# Patient Record
Sex: Female | Born: 1990 | Race: Black or African American | Hispanic: No | Marital: Single | Smoking: Current every day smoker
Health system: Southern US, Community
[De-identification: ages and names within clinical notes are randomized; demographics above are authoritative.]

## PROBLEM LIST (undated history)

## (undated) ENCOUNTER — Inpatient Hospital Stay (HOSPITAL_COMMUNITY): Payer: Self-pay

## (undated) DIAGNOSIS — N760 Acute vaginitis: Secondary | ICD-10-CM

## (undated) DIAGNOSIS — A749 Chlamydial infection, unspecified: Secondary | ICD-10-CM

## (undated) DIAGNOSIS — A599 Trichomoniasis, unspecified: Secondary | ICD-10-CM

## (undated) DIAGNOSIS — B9689 Other specified bacterial agents as the cause of diseases classified elsewhere: Secondary | ICD-10-CM

## (undated) HISTORY — PX: INDUCED ABORTION: SHX677

---
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
## 2011-03-25 ENCOUNTER — Encounter: Payer: Self-pay | Admitting: *Deleted

## 2011-03-25 ENCOUNTER — Emergency Department (HOSPITAL_COMMUNITY)
Admission: EM | Admit: 2011-03-25 | Discharge: 2011-03-25 | Discharge status: Against Medical Advice | Payer: Medicaid Other | Attending: Emergency Medicine | Admitting: Emergency Medicine

## 2011-03-25 ENCOUNTER — Emergency Department (INDEPENDENT_AMBULATORY_CARE_PROVIDER_SITE_OTHER)
Admission: EM | Admit: 2011-03-25 | Discharge: 2011-03-25 | Disposition: A | Discharge status: Home / Self Care | Payer: Medicaid Other | Source: Home / Self Care | Attending: Emergency Medicine | Admitting: Emergency Medicine

## 2011-03-25 DIAGNOSIS — N760 Acute vaginitis: Secondary | ICD-10-CM

## 2011-03-25 DIAGNOSIS — B9689 Other specified bacterial agents as the cause of diseases classified elsewhere: Secondary | ICD-10-CM

## 2011-03-25 DIAGNOSIS — A499 Bacterial infection, unspecified: Secondary | ICD-10-CM

## 2011-03-25 DIAGNOSIS — Z0389 Encounter for observation for other suspected diseases and conditions ruled out: Secondary | ICD-10-CM | POA: Insufficient documentation

## 2011-03-25 LAB — POCT URINALYSIS DIP (DEVICE)
Glucose, UA: NEGATIVE mg/dL
Leukocytes, UA: NEGATIVE
Protein, ur: NEGATIVE mg/dL
Specific Gravity, Urine: 1.025 (ref 1.005–1.030)
Urobilinogen, UA: 1 mg/dL (ref 0.0–1.0)

## 2011-03-25 LAB — POCT PREGNANCY, URINE: Preg Test, Ur: NEGATIVE

## 2011-03-25 LAB — WET PREP, GENITAL: Trich, Wet Prep: NONE SEEN

## 2011-03-25 MED ORDER — METRONIDAZOLE 500 MG PO TABS: 500.0000 mg | ORAL_TABLET | Freq: Two times a day (BID) | ORAL | Status: DC

## 2011-03-25 NOTE — ED Notes (Signed)
Pt called five different times without answer.

## 2011-03-25 NOTE — ED Notes (Signed)
Pt  Reports  Symptoms  Of  Vaginal  Discharge          With  Foul  Odor   X  1  Week  She  denya  Any pain she  Is  In no distress  Walks  urpright     With steady  Gait and is  pleasant

## 2011-03-25 NOTE — ED Provider Notes (Signed)
History     CSN: [id number]  Arrival date & time 03/25/11  1758   First MD Initiated Contact with Patient 03/25/11 1804      Chief Complaint  Patient presents with  . Vaginal Discharge    (Consider location/radiation/quality/duration/timing/severity/associated sxs/prior treatment) HPI Comments: She is a 20 year old female who has had a one-week history of malodorous vaginal discharge. She denies any itching or irritation. She doesn't have any pelvic pain. She denies any urinary symptoms. She does have a history of bacterial vaginosis in the past. No history of yeast infections or STDs.  Patient is a 20 y.o. female presenting with vaginal discharge.  Vaginal Discharge Pertinent negatives include no abdominal pain.    History reviewed. No pertinent past medical history.  History reviewed. No pertinent past surgical history.  History reviewed. No pertinent family history.  History  Substance Use Topics  . Smoking status: Current Everyday Smoker  . Smokeless tobacco: Not on file  . Alcohol Use: Yes    OB History    Grav Para Term Preterm Abortions TAB SAB Ect Mult Living                  Review of Systems  Constitutional: Negative for fever and chills.  Gastrointestinal: Negative for nausea, vomiting, abdominal pain and diarrhea.  Genitourinary: Positive for vaginal discharge. Negative for dysuria, urgency, frequency, hematuria, vaginal bleeding, difficulty urinating, genital sores, vaginal pain, menstrual problem, pelvic pain and dyspareunia.    Allergies  Review of patient's allergies indicates no known allergies.  Home Medications   Current Outpatient Rx  Name Route Sig Dispense Refill  . METRONIDAZOLE 500 MG PO TABS Oral Take 1 tablet (500 mg total) by mouth 2 (two) times daily. 14 tablet 0    BP 123/63  Pulse 71  Temp(Src) 97.6 F (36.4 C) (Oral)  Resp 16  SpO2 100%  LMP 03/20/2011  Physical Exam  Nursing note and vitals reviewed. Constitutional:  She appears well-developed and well-nourished. No distress.  Cardiovascular: Normal rate, regular rhythm, normal heart sounds and intact distal pulses.  Exam reveals no gallop and no friction rub.   No murmur heard. Pulmonary/Chest: Effort normal and breath sounds normal. No respiratory distress. She has no wheezes. She has no rales.  Abdominal: Soft. Bowel sounds are normal. She exhibits no distension and no mass. There is no tenderness. There is no rebound and no guarding.  Genitourinary: Uterus normal. There is no rash or lesion on the right labia. There is no rash or lesion on the left labia. Uterus is not deviated, not enlarged, not fixed and not tender. Cervix exhibits no motion tenderness, no discharge and no friability. Right adnexum displays no mass and no tenderness. Left adnexum displays no mass and no tenderness. No erythema, tenderness or bleeding around the vagina. No foreign body around the vagina. Vaginal discharge found.       Pelvic exam reveals normal external genitalia. There was a small amount of whitish discharge which was non-malodorous. Cervix appears normal. No cervical motion tenderness. Uterus was midposition and normal in size and shape and nontender. No adnexal masses or tenderness.  Skin: Skin is warm and dry. No rash noted. She is not diaphoretic.    ED Course  Procedures (including critical care time)  Results for orders placed during the hospital encounter of 03/25/11  POCT URINALYSIS DIP (DEVICE)      Component Value Range   Glucose, UA NEGATIVE  NEGATIVE (mg/dL)   Bilirubin Urine NEGATIVE  NEGATIVE  Ketones, ur NEGATIVE  NEGATIVE (mg/dL)   Specific Gravity, Urine 1.025  1.005 - 1.030    Hgb urine dipstick NEGATIVE  NEGATIVE    pH 6.5  5.0 - 8.0    Protein, ur NEGATIVE  NEGATIVE (mg/dL)   Urobilinogen, UA 1.0  0.0 - 1.0 (mg/dL)   Nitrite NEGATIVE  NEGATIVE    Leukocytes, UA NEGATIVE  NEGATIVE   POCT PREGNANCY, URINE      Component Value Range   Preg  Test, Ur NEGATIVE    WET PREP, GENITAL      Component Value Range   Yeast, Wet Prep NONE SEEN  NONE SEEN    Trich, Wet Prep NONE SEEN  NONE SEEN    Clue Cells, Wet Prep MANY (*) NONE SEEN    WBC, Wet Prep HPF POC FEW (*) NONE SEEN     Labs Reviewed  WET PREP, GENITAL - Abnormal; Notable for the following:    Clue Cells, Wet Prep MANY (*)    WBC, Wet Prep HPF POC FEW (*)    All other components within normal limits  POCT URINALYSIS DIP (DEVICE)  POCT PREGNANCY, URINE  POCT URINALYSIS DIPSTICK  POCT PREGNANCY, URINE  POCT PREGNANCY, URINE  GC/CHLAMYDIA PROBE AMP, GENITAL   No results found.   1. Bacterial vaginosis       MDM          Roque Lias, MD 03/25/11 2059

## 2011-03-26 LAB — GC/CHLAMYDIA PROBE AMP, GENITAL: GC Probe Amp, Genital: NEGATIVE

## 2011-03-26 NOTE — ED Notes (Signed)
Wet prep: Many clue cells, few WBC's, GC/Chlamydia neg.  Pt. adequately treated with Flagyl. Vassie Moselle 03/26/2011

## 2011-04-04 ENCOUNTER — Emergency Department (INDEPENDENT_AMBULATORY_CARE_PROVIDER_SITE_OTHER)
Admission: EM | Admit: 2011-04-04 | Discharge: 2011-04-04 | Disposition: A | Discharge status: Home / Self Care | Payer: Medicaid Other | Source: Home / Self Care | Attending: Emergency Medicine | Admitting: Emergency Medicine

## 2011-04-04 ENCOUNTER — Encounter (HOSPITAL_COMMUNITY): Payer: Self-pay | Admitting: *Deleted

## 2011-04-04 DIAGNOSIS — N739 Female pelvic inflammatory disease, unspecified: Secondary | ICD-10-CM

## 2011-04-04 HISTORY — DX: Other specified bacterial agents as the cause of diseases classified elsewhere: B96.89

## 2011-04-04 HISTORY — DX: Acute vaginitis: N76.0

## 2011-04-04 LAB — CBC
HCT: 43.5 % (ref 36.0–46.0)
MCHC: 34.7 g/dL (ref 30.0–36.0)
Platelets: 275 10*3/uL (ref 150–400)
RDW: 13.7 % (ref 11.5–15.5)
WBC: 7.8 10*3/uL (ref 4.0–10.5)

## 2011-04-04 LAB — POCT URINALYSIS DIP (DEVICE)
Bilirubin Urine: NEGATIVE
Glucose, UA: NEGATIVE mg/dL
Hgb urine dipstick: NEGATIVE
Ketones, ur: NEGATIVE mg/dL
Specific Gravity, Urine: 1.025 (ref 1.005–1.030)
pH: 7 (ref 5.0–8.0)

## 2011-04-04 LAB — DIFFERENTIAL
Basophils Absolute: 0 10*3/uL (ref 0.0–0.1)
Basophils Relative: 0 % (ref 0–1)
Lymphocytes Relative: 31 % (ref 12–46)
Monocytes Absolute: 0.5 10*3/uL (ref 0.1–1.0)
Neutro Abs: 4.9 10*3/uL (ref 1.7–7.7)

## 2011-04-04 LAB — WET PREP, GENITAL
Clue Cells Wet Prep HPF POC: NONE SEEN
Trich, Wet Prep: NONE SEEN
WBC, Wet Prep HPF POC: NONE SEEN

## 2011-04-04 LAB — POCT PREGNANCY, URINE: Preg Test, Ur: NEGATIVE

## 2011-04-04 MED ORDER — AZITHROMYCIN 250 MG PO TABS
ORAL_TABLET | ORAL | Status: AC
  Filled 2011-04-04: qty 4

## 2011-04-04 MED ORDER — LIDOCAINE HCL (PF) 1 % IJ SOLN
INTRAMUSCULAR | Status: AC
  Filled 2011-04-04: qty 5

## 2011-04-04 MED ORDER — AZITHROMYCIN 500 MG PO TABS: ORAL_TABLET | ORAL | Status: DC

## 2011-04-04 MED ORDER — CEFTRIAXONE SODIUM 250 MG IJ SOLR
250.0000 mg | Freq: Once | INTRAMUSCULAR | Status: AC
  Administered 2011-04-04: 250 mg via INTRAMUSCULAR

## 2011-04-04 MED ORDER — AZITHROMYCIN 250 MG PO TABS
1000.0000 mg | ORAL_TABLET | Freq: Once | ORAL | Status: AC
  Administered 2011-04-04: 1000 mg via ORAL

## 2011-04-04 MED ORDER — ONDANSETRON 4 MG PO TBDP
4.0000 mg | ORAL_TABLET | Freq: Once | ORAL | Status: AC
  Administered 2011-04-04: 4 mg via ORAL

## 2011-04-04 MED ORDER — METRONIDAZOLE 500 MG PO TABS: 500.0000 mg | ORAL_TABLET | Freq: Two times a day (BID) | ORAL | Status: AC

## 2011-04-04 MED ORDER — ONDANSETRON 4 MG PO TBDP
ORAL_TABLET | ORAL | Status: AC
  Filled 2011-04-04: qty 1

## 2011-04-04 MED ORDER — CEFTRIAXONE SODIUM 250 MG IJ SOLR
INTRAMUSCULAR | Status: AC
  Filled 2011-04-04: qty 250

## 2011-04-04 NOTE — ED Provider Notes (Signed)
History     CSN: [id number]  Arrival date & time 04/04/11  1237   First MD Initiated Contact with Patient 04/04/11 1317      Chief Complaint  Patient presents with  . Abdominal Pain    (Consider location/radiation/quality/duration/timing/severity/associated sxs/prior treatment) HPI Comments: Tammy Gardner is a 20 year old female who was seen here 10 days ago with vaginal discharge. Wet prep showed clue cells and she was treated with metronidazole. Her discharge has gotten better. She also had a negative urinalysis and negative GC and chlamydia probe. Over the past 2 or 3 days she's developed pain in the right flank area. This initially was intermittent but now has become constant and it seems to be getting worse, however it's only a 3-4/10 in intensity. It's worse if she stands up and better if she sits. Not worse with eating or with intercourse. She denies any fever, chills, sweats, anorexia, nausea, vomiting, constipation, diarrhea, or blood in the stool. She's had no dysuria, frequency, urgency, or hematuria. She denies any vaginal discharge or itching. Her last menstrual period was December 15. She is sexually active and not using condoms or any other form of birth control.  Patient is a 20 y.o. female presenting with abdominal pain.  Abdominal Pain The primary symptoms of the illness include abdominal pain. The primary symptoms of the illness do not include fever, shortness of breath, nausea, vomiting, diarrhea or dysuria.  Symptoms associated with the illness do not include chills, constipation, urgency or frequency.    Past Medical History  Diagnosis Date  . Bacterial vaginosis     History reviewed. No pertinent past surgical history.  History reviewed. No pertinent family history.  History  Substance Use Topics  . Smoking status: Current Everyday Smoker  . Smokeless tobacco: Not on file  . Alcohol Use: Yes    OB History    Grav Para Term Preterm Abortions TAB SAB Ect Mult  Living                  Review of Systems  Constitutional: Negative for fever, chills, appetite change and unexpected weight change.  Respiratory: Negative for cough, shortness of breath and wheezing.   Cardiovascular: Negative for chest pain.  Gastrointestinal: Positive for abdominal pain. Negative for nausea, vomiting, diarrhea, constipation, blood in stool, abdominal distention, anal bleeding and rectal pain.  Genitourinary: Negative for dysuria, urgency and frequency.  Skin: Negative for rash.    Allergies  Review of patient's allergies indicates no known allergies.  Home Medications   Current Outpatient Rx  Name Route Sig Dispense Refill  . AZITHROMYCIN 500 MG PO TABS  Take 2 tablets at one time on Sunday, January 6,2013. 2 tablet 0  . METRONIDAZOLE 500 MG PO TABS Oral Take 1 tablet (500 mg total) by mouth 2 (two) times daily. 28 tablet 0    BP 138/77  Pulse 66  Temp(Src) 98.2 F (36.8 C) (Oral)  Resp 16  SpO2 100%  LMP 03/20/2011  Physical Exam  Nursing note and vitals reviewed. Constitutional: She appears well-developed and well-nourished. No distress.  Eyes: No scleral icterus.  Cardiovascular: Normal rate, regular rhythm and normal heart sounds.  Exam reveals no gallop and no friction rub.   No murmur heard. Pulmonary/Chest: Effort normal and breath sounds normal. No respiratory distress. She has no wheezes. She has no rales.  Abdominal: Soft. Bowel sounds are normal. She exhibits no distension and no mass. There is no hepatosplenomegaly. There is tenderness (there was mild tenderness to  palpation in the right flank area without guarding or rebound. She had no right lower quadrant tenderness or tenderness elsewhere.). There is no rebound, no guarding and no CVA tenderness.  Genitourinary: Vagina normal. No vaginal discharge found.       Pelvic exam reveals normal external genitalia. Vaginal and cervical mucosa were unremarkable. There was no discharge. She had  moderate cervical motion tenderness. Uterus was midposition and normal in size and shape and mildly tender. She had mild bilateral adnexal tenderness without any masses.  Skin: Skin is warm and dry. No rash noted. She is not diaphoretic.    ED Course  Procedures (including critical care time)  Results for orders placed during the hospital encounter of 04/04/11  POCT URINALYSIS DIP (DEVICE)      Component Value Range   Glucose, UA NEGATIVE  NEGATIVE (mg/dL)   Bilirubin Urine NEGATIVE  NEGATIVE    Ketones, ur NEGATIVE  NEGATIVE (mg/dL)   Specific Gravity, Urine 1.025  1.005 - 1.030    Hgb urine dipstick NEGATIVE  NEGATIVE    pH 7.0  5.0 - 8.0    Protein, ur NEGATIVE  NEGATIVE (mg/dL)   Urobilinogen, UA 0.2  0.0 - 1.0 (mg/dL)   Nitrite NEGATIVE  NEGATIVE    Leukocytes, UA NEGATIVE  NEGATIVE   WET PREP, GENITAL      Component Value Range   Yeast, Wet Prep NONE SEEN  NONE SEEN    Trich, Wet Prep NONE SEEN  NONE SEEN    Clue Cells, Wet Prep NONE SEEN  NONE SEEN    WBC, Wet Prep HPF POC NONE SEEN  NONE SEEN   CBC      Component Value Range   WBC 7.8  4.0 - 10.5 (K/uL)   RBC 5.11  3.87 - 5.11 (MIL/uL)   Hemoglobin 15.1 (*) 12.0 - 15.0 (g/dL)   HCT 16.1  09.6 - 04.5 (%)   MCV 85.1  78.0 - 100.0 (fL)   MCH 29.5  26.0 - 34.0 (pg)   MCHC 34.7  30.0 - 36.0 (g/dL)   RDW 40.9  81.1 - 91.4 (%)   Platelets 275  150 - 400 (K/uL)  DIFFERENTIAL      Component Value Range   Neutrophils Relative 62  43 - 77 (%)   Neutro Abs 4.9  1.7 - 7.7 (K/uL)   Lymphocytes Relative 31  12 - 46 (%)   Lymphs Abs 2.4  0.7 - 4.0 (K/uL)   Monocytes Relative 6  3 - 12 (%)   Monocytes Absolute 0.5  0.1 - 1.0 (K/uL)   Eosinophils Relative 1  0 - 5 (%)   Eosinophils Absolute 0.0  0.0 - 0.7 (K/uL)   Basophils Relative 0  0 - 1 (%)   Basophils Absolute 0.0  0.0 - 0.1 (K/uL)  POCT PREGNANCY, URINE      Component Value Range   Preg Test, Ur NEGATIVE       Labs Reviewed  CBC - Abnormal; Notable for the  following:    Hemoglobin 15.1 (*)    All other components within normal limits  POCT URINALYSIS DIP (DEVICE)  WET PREP, GENITAL  DIFFERENTIAL  POCT PREGNANCY, URINE  POCT URINALYSIS DIPSTICK  POCT PREGNANCY, URINE  GC/CHLAMYDIA PROBE AMP, GENITAL   No results found.   1. Pelvic inflammatory disease       MDM  She has right flank tenderness with a normal CBC and urinalysis. On pelvic there is mild to moderate tenderness on  cervical motion and over the adnexa bilaterally consistent with possible PID. Will go ahead and treat for pelvic inflammatory disease with IM Rocephin, azithromycin 1 g now and 1 g it week by mouth and metronidazole 500 mg twice a day for 2 weeks. She was instructed to followup with a gynecologist to do better in 2 weeks and return here if she became worse in any way.        Roque Lias, MD 04/04/11 [phone]

## 2011-04-04 NOTE — ED Notes (Signed)
Pt given crackers and soda. 

## 2011-04-04 NOTE — ED Notes (Signed)
Pt discharged immediately back at desk stated felt nauseous vomited small amount dr Lorenz Coaster notified - zofran 4mg  odt given and pt discharged

## 2011-04-04 NOTE — ED Notes (Signed)
Pt with c/o abdominal pain right mid to lower abd onset x 2 days - pt recently treated with flagyl completed antibiotic yesterday - denies vaginal discharge or irritation - denies urinary symptoms - pt states afraid may be chlamydia

## 2011-04-06 LAB — GC/CHLAMYDIA PROBE AMP, GENITAL
Chlamydia, DNA Probe: UNDETERMINED
GC Probe Amp, Genital: NEGATIVE

## 2011-04-07 ENCOUNTER — Telehealth (HOSPITAL_COMMUNITY): Payer: Self-pay | Admitting: *Deleted

## 2011-04-07 NOTE — ED Notes (Signed)
GC neg., Chlamydia indeterminate.  Pt. adequately treated with Zithromax. Pt. Verified, notified and given instructions.  She asked if she needs to take the Metrondizole.  I told her yes to finish it.   No DHHS form required for Indeterminate results. Tammy Gardner 04/07/2011

## 2011-04-12 NOTE — ED Notes (Signed)
1/2 Discussed with Dr. Lorenz Coaster and he said pt. needs to finish metronidazole and remind her to take the 2nd dose of Azithromycin. Left message 1/3 and 1/4.  Pt. called back and was given these instructions. Pt.'s questions about her Rx. answered. Vassie Moselle 04/12/2011

## 2011-04-26 ENCOUNTER — Telehealth (HOSPITAL_COMMUNITY): Payer: Self-pay | Admitting: *Deleted

## 2011-04-29 ENCOUNTER — Encounter (HOSPITAL_COMMUNITY): Payer: Self-pay | Admitting: *Deleted

## 2011-04-29 ENCOUNTER — Emergency Department (INDEPENDENT_AMBULATORY_CARE_PROVIDER_SITE_OTHER)
Admission: EM | Admit: 2011-04-29 | Discharge: 2011-04-29 | Disposition: A | Discharge status: Home / Self Care | Payer: Self-pay | Source: Home / Self Care | Attending: Family Medicine | Admitting: Family Medicine

## 2011-04-29 DIAGNOSIS — B373 Candidiasis of vulva and vagina: Secondary | ICD-10-CM

## 2011-04-29 HISTORY — DX: Chlamydial infection, unspecified: A74.9

## 2011-04-29 HISTORY — DX: Trichomoniasis, unspecified: A59.9

## 2011-04-29 LAB — POCT PREGNANCY, URINE: Preg Test, Ur: NEGATIVE

## 2011-04-29 LAB — POCT URINALYSIS DIP (DEVICE)
Bilirubin Urine: NEGATIVE
Nitrite: NEGATIVE
Protein, ur: NEGATIVE mg/dL
pH: 7 (ref 5.0–8.0)

## 2011-04-29 LAB — WET PREP, GENITAL: Clue Cells Wet Prep HPF POC: NONE SEEN

## 2011-04-29 MED ORDER — FLUCONAZOLE 150 MG PO TABS: ORAL_TABLET | ORAL | Status: AC

## 2011-04-29 NOTE — ED Provider Notes (Signed)
History     CSN: [id number]  Arrival date & time 04/29/11  1738   First MD Initiated Contact with Patient 04/29/11 1811      Chief Complaint  Patient presents with  . Vaginal Discharge    (Consider location/radiation/quality/duration/timing/severity/associated sxs/prior treatment) HPI Comments: Patient with a nonodorous white vaginal discharge and vaginal itching for the past several days. States that this started after being treated for PID. Patient states that she took the second gram of azithromycin  and 2 weeks of Flagyl. Patient also states had sexual intercourse with the same female partner and did not use condoms. STDs not a concern today. No urinary urgency, frequency, dysuria, genital blisters, rash, abdominal pain, fevers, back pain. Has not tried anything for this. Similar sx before when had yeast infection. Also with h/o BV chlamydia, trichmonoas. No h/o gonorrhea syphilis, herpes, HIV.   ROS as noted in HPI. All other ROS negative.   The history is provided by the patient. No language interpreter was used.    Past Medical History  Diagnosis Date  . Bacterial vaginosis     History reviewed. No pertinent past surgical history.  History reviewed. No pertinent family history.  History  Substance Use Topics  . Smoking status: Current Everyday Smoker  . Smokeless tobacco: Not on file  . Alcohol Use: Yes    OB History    Grav Para Term Preterm Abortions TAB SAB Ect Mult Living                  Review of Systems  Allergies  Review of patient's allergies indicates no known allergies.  Home Medications   Current Outpatient Rx  Name Route Sig Dispense Refill  . FLUCONAZOLE 150 MG PO TABS  1 tab po x 1. May repeat in 72 hours if no improvement 2 tablet 0    BP 130/72  Pulse 82  Temp(Src) 97.8 F (36.6 C) (Oral)  Resp 16  SpO2 100%  LMP 04/14/2011  Physical Exam  Nursing note and vitals reviewed. Constitutional: She is oriented to person, place, and  time. She appears well-developed and well-nourished. No distress.  HENT:  Head: Normocephalic and atraumatic.  Eyes: EOM are normal. Pupils are equal, round, and reactive to light.  Neck: Normal range of motion.  Cardiovascular: Normal rate, regular rhythm and normal heart sounds.   Pulmonary/Chest: Effort normal and breath sounds normal.  Abdominal: Soft. Bowel sounds are normal. She exhibits no distension. There is no tenderness. There is no rebound and no guarding.  Genitourinary: Uterus normal. Pelvic exam was performed with patient supine. There is no rash on the right labia. There is no rash on the left labia. Uterus is not tender. Cervix exhibits discharge. Cervix exhibits no motion tenderness and no friability. Right adnexum displays no mass, no tenderness and no fullness. Left adnexum displays no mass, no tenderness and no fullness. No erythema, tenderness or bleeding around the vagina. No foreign body around the vagina.       Thick white nonoderous vaginal d/c with cottage cheese appearance.Chaperone present during exam  Musculoskeletal: Normal range of motion.  Neurological: She is alert and oriented to person, place, and time.  Skin: Skin is warm and dry.  Psychiatric: She has a normal mood and affect. Her behavior is normal. Judgment and thought content normal.    ED Course  Procedures (including critical care time)  Labs Reviewed  POCT URINALYSIS DIP (DEVICE) - Abnormal; Notable for the following:    Leukocytes, UA  SMALL (*) Biochemical Testing Only. Please order routine urinalysis from main lab if confirmatory testing is needed.   All other components within normal limits  POCT PREGNANCY, URINE  POCT URINALYSIS DIPSTICK  POCT PREGNANCY, URINE  WET PREP, GENITAL  GC/CHLAMYDIA PROBE AMP, GENITAL   No results found.   1. Yeast vaginitis      MDM  Patient tx'd 12/20 for BV.  seen again on 04/04/2011 with lower abdominal and right flank pain. UA CBC wet prep were all  normal. Chlamydia indeterminate. Patient thought to have PID, was treated with IM Rocephin, azithromycin, repeat dose of azithromycin 1 g one week later and Flagyl 500 mg twice a day for 2 weeks.  H&P most c/w yeast infectionSent off GC/chlamydia, wet prep Will not treat empirically now. udcip noted. Pt with no urinary c/o. Will send home with diflucan for yeast infection. Advised pt to refrain from sexual contact until she knows lab results, symptoms resolve, and partner(s) are treated. Pt provided working phone number. Pt agrees.    Luiz Blare, MD 04/29/11 2007

## 2011-04-29 NOTE — ED Notes (Signed)
States she finished all rx medication the way she was supposed to take it

## 2011-04-29 NOTE — ED Notes (Signed)
States still having white vaginal discharge that looks like cottage cheese. States there is no odor

## 2011-04-30 LAB — GC/CHLAMYDIA PROBE AMP, GENITAL
Chlamydia, DNA Probe: NEGATIVE
GC Probe Amp, Genital: NEGATIVE

## 2011-05-03 NOTE — ED Notes (Signed)
GC/Chlamydia neg., Wet prep: Many yeast, mod. WBC's.  Pt. adequately treated with Diflucan. Vassie Moselle 05/03/2011

## 2011-05-14 ENCOUNTER — Telehealth (HOSPITAL_COMMUNITY): Payer: Self-pay | Admitting: *Deleted

## 2011-10-23 ENCOUNTER — Emergency Department (HOSPITAL_COMMUNITY)
Admission: EM | Admit: 2011-10-23 | Discharge: 2011-10-23 | Disposition: A | Discharge status: Home / Self Care | Payer: Self-pay

## 2011-10-23 NOTE — ED Notes (Signed)
Pt called x1. Unable to locate at the time. 

## 2012-03-31 ENCOUNTER — Encounter (HOSPITAL_BASED_OUTPATIENT_CLINIC_OR_DEPARTMENT_OTHER): Payer: Self-pay

## 2012-03-31 ENCOUNTER — Emergency Department (HOSPITAL_BASED_OUTPATIENT_CLINIC_OR_DEPARTMENT_OTHER)
Admission: EM | Admit: 2012-03-31 | Discharge: 2012-03-31 | Disposition: A | Discharge status: Home / Self Care | Payer: Self-pay | Attending: Emergency Medicine | Admitting: Emergency Medicine

## 2012-03-31 DIAGNOSIS — Z8619 Personal history of other infectious and parasitic diseases: Secondary | ICD-10-CM | POA: Insufficient documentation

## 2012-03-31 DIAGNOSIS — N760 Acute vaginitis: Secondary | ICD-10-CM | POA: Insufficient documentation

## 2012-03-31 DIAGNOSIS — B9689 Other specified bacterial agents as the cause of diseases classified elsewhere: Secondary | ICD-10-CM

## 2012-03-31 DIAGNOSIS — Z87891 Personal history of nicotine dependence: Secondary | ICD-10-CM | POA: Insufficient documentation

## 2012-03-31 DIAGNOSIS — R3915 Urgency of urination: Secondary | ICD-10-CM | POA: Insufficient documentation

## 2012-03-31 DIAGNOSIS — Z3202 Encounter for pregnancy test, result negative: Secondary | ICD-10-CM | POA: Insufficient documentation

## 2012-03-31 DIAGNOSIS — Z8742 Personal history of other diseases of the female genital tract: Secondary | ICD-10-CM | POA: Insufficient documentation

## 2012-03-31 LAB — PREGNANCY, URINE: Preg Test, Ur: NEGATIVE

## 2012-03-31 LAB — URINALYSIS, ROUTINE W REFLEX MICROSCOPIC
Ketones, ur: NEGATIVE mg/dL
Nitrite: NEGATIVE
Protein, ur: NEGATIVE mg/dL
Urobilinogen, UA: 1 mg/dL (ref 0.0–1.0)

## 2012-03-31 LAB — WET PREP, GENITAL

## 2012-03-31 LAB — URINE MICROSCOPIC-ADD ON

## 2012-03-31 MED ORDER — METRONIDAZOLE 500 MG PO TABS: 500.0000 mg | ORAL_TABLET | Freq: Two times a day (BID) | ORAL | Status: DC

## 2012-03-31 MED ORDER — METRONIDAZOLE 500 MG PO TABS
500.0000 mg | ORAL_TABLET | Freq: Once | ORAL | Status: AC
  Administered 2012-03-31: 500 mg via ORAL
  Filled 2012-03-31: qty 1

## 2012-03-31 NOTE — ED Provider Notes (Signed)
History     CSN: [id number]  Arrival date & time 03/31/12  1048   First MD Initiated Contact with Patient 03/31/12 1101      Chief Complaint  Patient presents with  . Vaginal Discharge    (Consider location/radiation/quality/duration/timing/severity/associated sxs/prior treatment) The history is provided by the patient.  Tammy Gardner is a 21 y.o. female hx of recurrent BV, chlamydia here with vaginal discharge, urgency to void. Whitish vaginal discharge for 5 days. No foul odor. Had BV a month ago and finished 7 days of flagyl. No abdominal pain or cramps. + urgency but no dysuria or frequency. No hx of UTIs. She hasn't been sexually active for 2 months. No fevers.    Past Medical History  Diagnosis Date  . Bacterial vaginosis   . Chlamydia   . Trichomonas     History reviewed. No pertinent past surgical history.  No family history on file.  History  Substance Use Topics  . Smoking status: Former Games developer  . Smokeless tobacco: Not on file  . Alcohol Use: No    OB History    Grav Para Term Preterm Abortions TAB SAB Ect Mult Living                  Review of Systems  Genitourinary: Positive for vaginal discharge and difficulty urinating.  All other systems reviewed and are negative.    Allergies  Review of patient's allergies indicates no known allergies.  Home Medications  No current outpatient prescriptions on file.  BP 119/71  Pulse 66  Temp 97.9 F (36.6 C) (Oral)  Resp 20  Ht 4\' 11"  (1.499 m)  Wt 115 lb (52.164 kg)  BMI 23.23 kg/m2  SpO2 100%  LMP 02/04/2012  Physical Exam  Nursing note and vitals reviewed. Constitutional: She is oriented to person, place, and time. She appears well-developed and well-nourished.  HENT:  Head: Normocephalic.  Mouth/Throat: Oropharynx is clear and moist.  Eyes: Conjunctivae normal are normal. Pupils are equal, round, and reactive to light.  Neck: Normal range of motion. Neck supple.  Cardiovascular: Normal  rate.   Pulmonary/Chest: Effort normal.  Abdominal: Soft. Bowel sounds are normal. She exhibits no distension. There is no tenderness. There is no rebound.  Genitourinary:       Whitish discharge, not foul smelling. No CMT or adnexal tenderness or uterine tenderness.   Musculoskeletal: Normal range of motion.  Neurological: She is alert and oriented to person, place, and time.  Skin: Skin is warm and dry.  Psychiatric: She has a normal mood and affect. Her behavior is normal. Judgment and thought content normal.    ED Course  Procedures (including critical care time)  Labs Reviewed  URINALYSIS, ROUTINE W REFLEX MICROSCOPIC - Abnormal; Notable for the following:    Color, Urine AMBER (*)  BIOCHEMICALS MAY BE AFFECTED BY COLOR   APPearance TURBID (*)     Specific Gravity, Urine 1.034 (*)     Bilirubin Urine SMALL (*)     Leukocytes, UA TRACE (*)     All other components within normal limits  WET PREP, GENITAL - Abnormal; Notable for the following:    Clue Cells Wet Prep HPF POC FEW (*)     WBC, Wet Prep HPF POC MODERATE (*)     All other components within normal limits  URINE MICROSCOPIC-ADD ON - Abnormal; Notable for the following:    Squamous Epithelial / LPF MANY (*)     Bacteria, UA MANY (*)  All other components within normal limits  PREGNANCY, URINE  GC/CHLAMYDIA PROBE AMP  URINE CULTURE   No results found.   No diagnosis found.    MDM  Tammy Gardner is a 21 y.o. female here with vaginal discharge. Possible recurrent BV. Will send wet prep and GC/chlamydia.   12:39 PM UA + WBC. Wep prep + WBC and clue cells. I think she likely has recurrent BV. Will prescribe another course of flagyl x 10 days. I think the WBC in the UA is likely contaminant from the BV. I held off on treating her for UTI. I ordered urine culture. She can f/u with PCP.       Richardean Canal, MD 03/31/12 1242

## 2012-03-31 NOTE — ED Notes (Signed)
C/o vaginal d/c and increased urgency to void x 1 week

## 2012-04-01 LAB — URINE CULTURE

## 2012-04-01 LAB — GC/CHLAMYDIA PROBE AMP: GC Probe RNA: NEGATIVE

## 2012-05-02 ENCOUNTER — Encounter (HOSPITAL_BASED_OUTPATIENT_CLINIC_OR_DEPARTMENT_OTHER): Payer: Self-pay | Admitting: *Deleted

## 2012-05-02 ENCOUNTER — Emergency Department (HOSPITAL_BASED_OUTPATIENT_CLINIC_OR_DEPARTMENT_OTHER)
Admission: EM | Admit: 2012-05-02 | Discharge: 2012-05-02 | Disposition: A | Discharge status: Home / Self Care | Payer: Self-pay | Attending: Emergency Medicine | Admitting: Emergency Medicine

## 2012-05-02 DIAGNOSIS — N760 Acute vaginitis: Secondary | ICD-10-CM | POA: Insufficient documentation

## 2012-05-02 DIAGNOSIS — Z3202 Encounter for pregnancy test, result negative: Secondary | ICD-10-CM | POA: Insufficient documentation

## 2012-05-02 DIAGNOSIS — Z87891 Personal history of nicotine dependence: Secondary | ICD-10-CM | POA: Insufficient documentation

## 2012-05-02 DIAGNOSIS — Z8619 Personal history of other infectious and parasitic diseases: Secondary | ICD-10-CM | POA: Insufficient documentation

## 2012-05-02 DIAGNOSIS — Z8742 Personal history of other diseases of the female genital tract: Secondary | ICD-10-CM | POA: Insufficient documentation

## 2012-05-02 LAB — PREGNANCY, URINE: Preg Test, Ur: NEGATIVE

## 2012-05-02 LAB — URINE MICROSCOPIC-ADD ON

## 2012-05-02 LAB — URINALYSIS, ROUTINE W REFLEX MICROSCOPIC
Ketones, ur: NEGATIVE mg/dL
Leukocytes, UA: NEGATIVE
Nitrite: NEGATIVE
Protein, ur: NEGATIVE mg/dL
pH: 6 (ref 5.0–8.0)

## 2012-05-02 LAB — WET PREP, GENITAL

## 2012-05-02 NOTE — ED Notes (Signed)
Pt reports strong vaginal order and request examination

## 2012-05-02 NOTE — ED Notes (Signed)
Pt c/o vaginal discharge with odor x 2 days

## 2012-05-02 NOTE — ED Provider Notes (Signed)
History     CSN: [id number]  Arrival date & time 05/02/12  0012   First MD Initiated Contact with Patient 05/02/12 0024      Chief Complaint  Patient presents with  . Vaginal Discharge     Patient is a 22 y.o. female presenting with vaginal discharge. The history is provided by the patient.  Vaginal Discharge This is a new problem. The current episode started yesterday. The problem occurs hourly. The problem has not changed since onset.Pertinent negatives include no abdominal pain. Nothing aggravates the symptoms. Nothing relieves the symptoms. She has tried nothing for the symptoms.    Past Medical History  Diagnosis Date  . Bacterial vaginosis   . Chlamydia   . Trichomonas     History reviewed. No pertinent past surgical history.  History reviewed. No pertinent family history.  History  Substance Use Topics  . Smoking status: Former Games developer  . Smokeless tobacco: Not on file  . Alcohol Use: No    OB History    Grav Para Term Preterm Abortions TAB SAB Ect Mult Living                  Review of Systems  Gastrointestinal: Negative for abdominal pain.  Genitourinary: Positive for vaginal discharge.    Allergies  Review of patient's allergies indicates no known allergies.  Home Medications   Current Outpatient Rx  Name  Route  Sig  Dispense  Refill  . METRONIDAZOLE 500 MG PO TABS   Oral   Take 1 tablet (500 mg total) by mouth 2 (two) times daily.   20 tablet   0     BP 119/75  Pulse 88  Temp 98.7 F (37.1 C) (Oral)  Resp 16  Ht 4\' 11"  (1.499 m)  Wt 115 lb (52.164 kg)  BMI 23.23 kg/m2  SpO2 100%  LMP 05/01/2012  Physical Exam CONSTITUTIONAL: Well developed/well nourished HEAD AND FACE: Normocephalic/atraumatic EYES: EOMI ENMT: Mucous membranes moist NECK: supple no meningeal signs CV: S1/S2 noted, no murmurs/rubs/gallops noted LUNGS: Lungs are clear to auscultation bilaterally, no apparent distress ABDOMEN: soft, nontender, no rebound or  guarding GU:no cva tenderness. Small amt of blood noted in vaginal vault.  No discharge noted.  No cmt noted.  Chaperone present NEURO: Pt is awake/alert, moves all extremitiesx4 EXTREMITIES: pulses normal, full ROM SKIN: warm, color normal PSYCH: no abnormalities of mood noted  ED Course  Procedures   Labs Reviewed  URINALYSIS, ROUTINE W REFLEX MICROSCOPIC - Abnormal; Notable for the following:    APPearance CLOUDY (*)     Hgb urine dipstick MODERATE (*)     All other components within normal limits  URINE MICROSCOPIC-ADD ON - Abnormal; Notable for the following:    Squamous Epithelial / LPF FEW (*)     Bacteria, UA FEW (*)     All other components within normal limits  PREGNANCY, URINE  GC/CHLAMYDIA PROBE AMP  WET PREP, GENITAL    Advised need for f/u with her gynecologist and health department She reports last checkup for HIV was 6 months ago and was negative, advised to f/u for this regularly    MDM  Nursing notes including past medical history and social history reviewed and considered in documentation Labs/vital reviewed and considered         Joya Gaskins, MD 05/02/12 [phone]

## 2012-05-03 LAB — GC/CHLAMYDIA PROBE AMP: GC Probe RNA: NEGATIVE

## 2012-05-16 ENCOUNTER — Encounter (HOSPITAL_BASED_OUTPATIENT_CLINIC_OR_DEPARTMENT_OTHER): Payer: Self-pay | Admitting: *Deleted

## 2012-05-16 ENCOUNTER — Emergency Department (HOSPITAL_BASED_OUTPATIENT_CLINIC_OR_DEPARTMENT_OTHER)
Admission: EM | Admit: 2012-05-16 | Discharge: 2012-05-17 | Disposition: A | Discharge status: Home / Self Care | Payer: Self-pay | Attending: Emergency Medicine | Admitting: Emergency Medicine

## 2012-05-16 DIAGNOSIS — Y9289 Other specified places as the place of occurrence of the external cause: Secondary | ICD-10-CM | POA: Insufficient documentation

## 2012-05-16 DIAGNOSIS — Z8619 Personal history of other infectious and parasitic diseases: Secondary | ICD-10-CM | POA: Insufficient documentation

## 2012-05-16 DIAGNOSIS — Z87891 Personal history of nicotine dependence: Secondary | ICD-10-CM | POA: Insufficient documentation

## 2012-05-16 DIAGNOSIS — T23279A Burn of second degree of unspecified wrist, initial encounter: Secondary | ICD-10-CM | POA: Insufficient documentation

## 2012-05-16 DIAGNOSIS — Y9389 Activity, other specified: Secondary | ICD-10-CM | POA: Insufficient documentation

## 2012-05-16 DIAGNOSIS — X19XXXA Contact with other heat and hot substances, initial encounter: Secondary | ICD-10-CM | POA: Insufficient documentation

## 2012-05-16 DIAGNOSIS — Z23 Encounter for immunization: Secondary | ICD-10-CM | POA: Insufficient documentation

## 2012-05-16 DIAGNOSIS — T300 Burn of unspecified body region, unspecified degree: Secondary | ICD-10-CM

## 2012-05-16 MED ORDER — SILVER SULFADIAZINE 1 % EX CREA: TOPICAL_CREAM | Freq: Every day | CUTANEOUS | Status: DC

## 2012-05-16 MED ORDER — TETANUS-DIPHTH-ACELL PERTUSSIS 5-2.5-18.5 LF-MCG/0.5 IM SUSP
0.5000 mL | Freq: Once | INTRAMUSCULAR | Status: AC
  Administered 2012-05-17: 0.5 mL via INTRAMUSCULAR
  Filled 2012-05-16: qty 0.5
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
## 2012-05-25 ENCOUNTER — Encounter: Payer: BC Managed Care – PPO | Admitting: Obstetrics and Gynecology

## 2012-06-01 ENCOUNTER — Encounter: Payer: Self-pay | Admitting: Obstetrics and Gynecology

## 2012-06-06 ENCOUNTER — Encounter (HOSPITAL_BASED_OUTPATIENT_CLINIC_OR_DEPARTMENT_OTHER): Payer: Self-pay | Admitting: *Deleted

## 2012-06-06 ENCOUNTER — Emergency Department (HOSPITAL_BASED_OUTPATIENT_CLINIC_OR_DEPARTMENT_OTHER)
Admission: EM | Admit: 2012-06-06 | Discharge: 2012-06-06 | Disposition: A | Discharge status: Home / Self Care | Payer: BC Managed Care – PPO | Attending: Emergency Medicine | Admitting: Emergency Medicine

## 2012-06-06 DIAGNOSIS — Z87891 Personal history of nicotine dependence: Secondary | ICD-10-CM | POA: Insufficient documentation

## 2012-06-06 DIAGNOSIS — N898 Other specified noninflammatory disorders of vagina: Secondary | ICD-10-CM

## 2012-06-06 DIAGNOSIS — Z3202 Encounter for pregnancy test, result negative: Secondary | ICD-10-CM | POA: Insufficient documentation

## 2012-06-06 DIAGNOSIS — N9489 Other specified conditions associated with female genital organs and menstrual cycle: Secondary | ICD-10-CM | POA: Insufficient documentation

## 2012-06-06 DIAGNOSIS — Z8619 Personal history of other infectious and parasitic diseases: Secondary | ICD-10-CM | POA: Insufficient documentation

## 2012-06-06 DIAGNOSIS — N760 Acute vaginitis: Secondary | ICD-10-CM | POA: Insufficient documentation

## 2012-06-06 LAB — GC/CHLAMYDIA PROBE AMP
CT Probe RNA: NEGATIVE
GC Probe RNA: NEGATIVE

## 2012-06-06 LAB — URINALYSIS, ROUTINE W REFLEX MICROSCOPIC
Bilirubin Urine: NEGATIVE
Ketones, ur: NEGATIVE mg/dL
Nitrite: NEGATIVE
Urobilinogen, UA: 1 mg/dL (ref 0.0–1.0)

## 2012-06-06 NOTE — ED Notes (Signed)
Feels like she has bacterial vaginosis again states she is having the same symptoms as before and is concerned because she states she is using condoms and has the same sex partner

## 2012-06-06 NOTE — ED Provider Notes (Signed)
History     CSN: [id number]  Arrival date & time 06/06/12  0815   First MD Initiated Contact with Patient 06/06/12 [phone]      Chief Complaint  Patient presents with  . Vaginal Discharge    (Consider location/radiation/quality/duration/timing/severity/associated sxs/prior treatment) HPI  Patient comes in today complaining that she has bacterial vaginosis again. She states she's had it multiple times and was last treated approximately one month and "some change" ago. She states she has had Chlamydia in the past. She is not currently having any signs of pain or sexually transmitted diseases per the patient.. She denies any pain. She states that she has an odor to her vaginal discharge although the discharge itself is slight. She is has the same sexual partner for the past year. She is using condoms for birth control. She states her last menstrual period was last week and was a normal menstrual cycle in a normal time. She has never been pregnant.  Past Medical History  Diagnosis Date  . Bacterial vaginosis   . Chlamydia   . Trichomonas     History reviewed. No pertinent past surgical history.  History reviewed. No pertinent family history.  History  Substance Use Topics  . Smoking status: Former Games developer  . Smokeless tobacco: Not on file  . Alcohol Use: No    OB History   Grav Para Term Preterm Abortions TAB SAB Ect Mult Living                  Review of Systems  Genitourinary: Positive for vaginal discharge.  All other systems reviewed and are negative.    Allergies  Review of patient's allergies indicates no known allergies.  Home Medications   Current Outpatient Rx  Name  Route  Sig  Dispense  Refill  . silver sulfADIAZINE (SILVADENE) 1 % cream   Topical   Apply topically daily.   50 g   0     BP 112/68  Pulse 66  Temp(Src) 98 F (36.7 C) (Oral)  Resp 18  SpO2 97%  LMP 05/29/2012  Physical Exam  Nursing note and vitals reviewed. Constitutional: She  appears well-developed and well-nourished.  HENT:  Head: Normocephalic and atraumatic.  Eyes: Conjunctivae and EOM are normal. Pupils are equal, round, and reactive to light.  Neck: Normal range of motion. Neck supple.  Cardiovascular: Normal rate, regular rhythm, normal heart sounds and intact distal pulses.   Pulmonary/Chest: Effort normal and breath sounds normal.  Abdominal: Soft. Bowel sounds are normal.  Genitourinary: Vagina normal and uterus normal. Cervix exhibits friability. Cervix exhibits no motion tenderness and no discharge. Right adnexum displays no mass, no tenderness and no fullness. Left adnexum displays no mass, no tenderness and no fullness.  Musculoskeletal: Normal range of motion.  Neurological: She is alert.  Skin: Skin is warm and dry.  Psychiatric: She has a normal mood and affect. Thought content normal.    ED Course  Procedures (including critical care time)  Labs Reviewed  URINALYSIS, ROUTINE W REFLEX MICROSCOPIC - Abnormal; Notable for the following:    APPearance CLOUDY (*)    All other components within normal limits  WET PREP, GENITAL  GC/CHLAMYDIA PROBE AMP  PREGNANCY, URINE   No results found.   No diagnosis found.    MDM  Patient with vaginal d/c but no trich or discharge or cmt.  Patient advised regarding conservative management and to f/u with gyn for pap and further treatment if needed.  Hilario Quarry, MD 06/06/12 [phone]

## 2012-06-06 NOTE — ED Notes (Signed)
Patient not in the room for her discharge instructions.

## 2012-07-26 ENCOUNTER — Emergency Department (HOSPITAL_BASED_OUTPATIENT_CLINIC_OR_DEPARTMENT_OTHER)
Admission: EM | Admit: 2012-07-26 | Discharge: 2012-07-26 | Disposition: A | Discharge status: Home / Self Care | Payer: BC Managed Care – PPO | Attending: Emergency Medicine | Admitting: Emergency Medicine

## 2012-07-26 ENCOUNTER — Encounter (HOSPITAL_BASED_OUTPATIENT_CLINIC_OR_DEPARTMENT_OTHER): Payer: Self-pay | Admitting: Emergency Medicine

## 2012-07-26 DIAGNOSIS — F17200 Nicotine dependence, unspecified, uncomplicated: Secondary | ICD-10-CM | POA: Insufficient documentation

## 2012-07-26 DIAGNOSIS — Y939 Activity, unspecified: Secondary | ICD-10-CM | POA: Insufficient documentation

## 2012-07-26 DIAGNOSIS — T192XXA Foreign body in vulva and vagina, initial encounter: Secondary | ICD-10-CM | POA: Insufficient documentation

## 2012-07-26 DIAGNOSIS — N760 Acute vaginitis: Secondary | ICD-10-CM | POA: Insufficient documentation

## 2012-07-26 DIAGNOSIS — Z8742 Personal history of other diseases of the female genital tract: Secondary | ICD-10-CM | POA: Insufficient documentation

## 2012-07-26 DIAGNOSIS — B9689 Other specified bacterial agents as the cause of diseases classified elsewhere: Secondary | ICD-10-CM

## 2012-07-26 DIAGNOSIS — Z8619 Personal history of other infectious and parasitic diseases: Secondary | ICD-10-CM | POA: Insufficient documentation

## 2012-07-26 DIAGNOSIS — IMO0002 Reserved for concepts with insufficient information to code with codable children: Secondary | ICD-10-CM | POA: Insufficient documentation

## 2012-07-26 DIAGNOSIS — Z3202 Encounter for pregnancy test, result negative: Secondary | ICD-10-CM | POA: Insufficient documentation

## 2012-07-26 DIAGNOSIS — Y929 Unspecified place or not applicable: Secondary | ICD-10-CM | POA: Insufficient documentation

## 2012-07-26 DIAGNOSIS — N390 Urinary tract infection, site not specified: Secondary | ICD-10-CM | POA: Insufficient documentation

## 2012-07-26 LAB — URINALYSIS, ROUTINE W REFLEX MICROSCOPIC
Bilirubin Urine: NEGATIVE
Glucose, UA: NEGATIVE mg/dL
Hgb urine dipstick: NEGATIVE
Nitrite: POSITIVE — AB
Specific Gravity, Urine: 1.029 (ref 1.005–1.030)
pH: 6.5 (ref 5.0–8.0)

## 2012-07-26 LAB — URINE MICROSCOPIC-ADD ON

## 2012-07-26 LAB — WET PREP, GENITAL
Trich, Wet Prep: NONE SEEN
Yeast Wet Prep HPF POC: NONE SEEN

## 2012-07-26 MED ORDER — METRONIDAZOLE 500 MG PO TABS: 500.0000 mg | ORAL_TABLET | Freq: Two times a day (BID) | ORAL | Status: DC

## 2012-07-26 MED ORDER — NITROFURANTOIN MONOHYD MACRO 100 MG PO CAPS: 100.0000 mg | ORAL_CAPSULE | Freq: Two times a day (BID) | ORAL | Status: DC

## 2012-07-26 NOTE — ED Provider Notes (Signed)
History     CSN: [id number]  Arrival date & time 07/26/12  0120   First MD Initiated Contact with Patient 07/26/12 0205      Chief Complaint  Patient presents with  . Vaginal Discharge    (Consider location/radiation/quality/duration/timing/severity/associated sxs/prior treatment) Patient is a 22 y.o. female presenting with vaginal discharge. The history is provided by the patient. No language interpreter was used.  Vaginal Discharge This is a recurrent problem. The current episode started more than 1 week ago. The problem occurs constantly. The problem has not changed since onset.Pertinent negatives include no abdominal pain. Nothing aggravates the symptoms. Nothing relieves the symptoms. She has tried nothing for the symptoms. The treatment provided no relief.    Past Medical History  Diagnosis Date  . Bacterial vaginosis   . Chlamydia   . Trichomonas     History reviewed. No pertinent past surgical history.  No family history on file.  History  Substance Use Topics  . Smoking status: Current Some Day Smoker  . Smokeless tobacco: Not on file  . Alcohol Use: Yes     Comment: occ    OB History   Grav Para Term Preterm Abortions TAB SAB Ect Mult Living                  Review of Systems  Gastrointestinal: Negative for abdominal pain.  Genitourinary: Positive for vaginal discharge.  All other systems reviewed and are negative.    Allergies  Review of patient's allergies indicates no known allergies.  Home Medications   Current Outpatient Rx  Name  Route  Sig  Dispense  Refill  . metroNIDAZOLE (FLAGYL) 500 MG tablet   Oral   Take 1 tablet (500 mg total) by mouth 2 (two) times daily. One po bid x 7 days   14 tablet   0   . nitrofurantoin, macrocrystal-monohydrate, (MACROBID) 100 MG capsule   Oral   Take 1 capsule (100 mg total) by mouth 2 (two) times daily. X 7 days   14 capsule   0   . silver sulfADIAZINE (SILVADENE) 1 % cream   Topical   Apply  topically daily.   50 g   0     BP 139/83  Pulse 88  Resp 16  Ht 4' 11.5" (1.511 m)  Wt 118 lb (53.524 kg)  BMI 23.44 kg/m2  SpO2 97%  LMP 06/22/2012  Physical Exam  Constitutional: She is oriented to person, place, and time. She appears well-developed and well-nourished. No distress.  HENT:  Head: Normocephalic and atraumatic.  Mouth/Throat: Oropharynx is clear and moist.  Eyes: Conjunctivae are normal. Pupils are equal, round, and reactive to light.  Neck: Normal range of motion. Neck supple.  Cardiovascular: Normal rate and regular rhythm.   Pulmonary/Chest: Effort normal and breath sounds normal. She has no wheezes. She has no rales.  Abdominal: Soft. Bowel sounds are normal. There is no tenderness. There is no rebound and no guarding.  Genitourinary: Vaginal discharge found.  Portion of a latex condom covering cervix  Chaperone present  Musculoskeletal: Normal range of motion.  Neurological: She is alert and oriented to person, place, and time.  Skin: Skin is warm and dry.  Psychiatric: She has a normal mood and affect.    ED Course  FOREIGN BODY REMOVAL Date/Time: 07/26/2012 2:35 AM Performed by: Jasmine Awe Authorized by: Jasmine Awe Patient identity confirmed: arm band Body area: vagina Patient sedated: no Patient restrained: no Patient cooperative: yes Localization  method: speculum Removal mechanism: ring forceps Complexity: simple 1 objects recovered. Objects recovered: portion of a latex condom Post-procedure assessment: foreign body removed Patient tolerance: Patient tolerated the procedure well with no immediate complications.   (including critical care time)  Labs Reviewed  WET PREP, GENITAL - Abnormal; Notable for the following:    Clue Cells Wet Prep HPF POC FEW (*)    WBC, Wet Prep HPF POC FEW (*)    All other components within normal limits  URINALYSIS, ROUTINE W REFLEX MICROSCOPIC - Abnormal; Notable for the following:     APPearance CLOUDY (*)    Nitrite POSITIVE (*)    Leukocytes, UA TRACE (*)    All other components within normal limits  URINE MICROSCOPIC-ADD ON - Abnormal; Notable for the following:    Squamous Epithelial / LPF MANY (*)    Bacteria, UA MANY (*)    All other components within normal limits  GC/CHLAMYDIA PROBE AMP  URINE CULTURE  PREGNANCY, URINE   No results found.   1. UTI (lower urinary tract infection)   2. Bacterial vaginosis       MDM  Will treat for uti and BV follow up with your GYN for ongoing care.         Jasmine Awe, MD 07/26/12 [phone]

## 2012-07-26 NOTE — ED Notes (Signed)
Thicker than normal vaginal discharge x1 week.  Denies burning or itching.

## 2012-07-28 LAB — URINE CULTURE: Colony Count: >=100000

## 2012-07-29 ENCOUNTER — Telehealth (HOSPITAL_COMMUNITY): Payer: Self-pay | Admitting: Emergency Medicine

## 2012-07-29 NOTE — ED Notes (Signed)
+  Urine. Patient treated with Macrobid. Sensitive to same. Per protocol MD. °

## 2012-07-29 NOTE — ED Notes (Signed)
Patient has +Urine culture. °

## 2012-08-12 ENCOUNTER — Other Ambulatory Visit: Payer: Self-pay | Admitting: Oncology

## 2012-09-05 ENCOUNTER — Encounter (HOSPITAL_BASED_OUTPATIENT_CLINIC_OR_DEPARTMENT_OTHER): Payer: Self-pay

## 2012-09-05 ENCOUNTER — Emergency Department (HOSPITAL_BASED_OUTPATIENT_CLINIC_OR_DEPARTMENT_OTHER)
Admission: EM | Admit: 2012-09-05 | Discharge: 2012-09-05 | Disposition: A | Discharge status: Home / Self Care | Payer: BC Managed Care – PPO | Attending: Emergency Medicine | Admitting: Emergency Medicine

## 2012-09-05 DIAGNOSIS — Z8619 Personal history of other infectious and parasitic diseases: Secondary | ICD-10-CM | POA: Insufficient documentation

## 2012-09-05 DIAGNOSIS — Z3202 Encounter for pregnancy test, result negative: Secondary | ICD-10-CM | POA: Insufficient documentation

## 2012-09-05 DIAGNOSIS — N760 Acute vaginitis: Secondary | ICD-10-CM | POA: Insufficient documentation

## 2012-09-05 DIAGNOSIS — B9689 Other specified bacterial agents as the cause of diseases classified elsewhere: Secondary | ICD-10-CM

## 2012-09-05 DIAGNOSIS — F17200 Nicotine dependence, unspecified, uncomplicated: Secondary | ICD-10-CM | POA: Insufficient documentation

## 2012-09-05 LAB — PREGNANCY, URINE: Preg Test, Ur: NEGATIVE

## 2012-09-05 LAB — URINALYSIS, ROUTINE W REFLEX MICROSCOPIC
Glucose, UA: NEGATIVE mg/dL
Ketones, ur: NEGATIVE mg/dL
Leukocytes, UA: NEGATIVE
Nitrite: NEGATIVE
Protein, ur: NEGATIVE mg/dL

## 2012-09-05 MED ORDER — METRONIDAZOLE 500 MG PO TABS: 500.0000 mg | ORAL_TABLET | Freq: Two times a day (BID) | ORAL | Status: DC
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
## 2012-09-05 NOTE — ED Provider Notes (Signed)
Medical screening examination/treatment/procedure(s) were performed by non-physician practitioner and as supervising physician I was immediately available for consultation/collaboration.   Gavin Pound. Candice Tobey, MD 09/05/12 1443

## 2012-09-05 NOTE — ED Notes (Signed)
Patient aware of need for urine specimen. She has cup and instructions for use. Patient states she used bathroom just PTA and cannot go again at this time

## 2012-09-05 NOTE — ED Provider Notes (Signed)
History     CSN: [id number]  Arrival date & time 09/05/12  1304   First MD Initiated Contact with Patient 09/05/12 1329      Chief Complaint  Patient presents with  . Vaginal Discharge    (Consider location/radiation/quality/duration/timing/severity/associated sxs/prior treatment) Patient is a 22 y.o. female presenting with vaginal discharge. The history is provided by the patient. No language interpreter was used.  Vaginal Discharge Quality:  Malodorous Severity:  Moderate Onset quality:  Gradual Timing:  Constant Progression:  Worsening Relieved by:  Nothing Worsened by:  Nothing tried   Past Medical History  Diagnosis Date  . Bacterial vaginosis   . Chlamydia   . Trichomonas     History reviewed. No pertinent past surgical history.  History reviewed. No pertinent family history.  History  Substance Use Topics  . Smoking status: Current Every Day Smoker    Types: Cigarettes  . Smokeless tobacco: Never Used  . Alcohol Use: Yes     Comment: occ    OB History   Grav Para Term Preterm Abortions TAB SAB Ect Mult Living                  Review of Systems  Genitourinary: Positive for vaginal discharge.  All other systems reviewed and are negative.    Allergies  Review of patient's allergies indicates no known allergies.  Home Medications   Current Outpatient Rx  Name  Route  Sig  Dispense  Refill  . metroNIDAZOLE (FLAGYL) 500 MG tablet   Oral   Take 1 tablet (500 mg total) by mouth 2 (two) times daily. One po bid x 7 days   14 tablet   0   . nitrofurantoin, macrocrystal-monohydrate, (MACROBID) 100 MG capsule   Oral   Take 1 capsule (100 mg total) by mouth 2 (two) times daily. X 7 days   14 capsule   0   . silver sulfADIAZINE (SILVADENE) 1 % cream   Topical   Apply topically daily.   50 g   0     BP 118/67  Pulse 59  Temp(Src) 98.4 F (36.9 C) (Oral)  Resp 15  Ht 4' 11.5" (1.511 m)  Wt 115 lb (52.164 kg)  BMI 22.85 kg/m2  SpO2 100%   LMP 08/27/2012  Physical Exam  Nursing note and vitals reviewed. Constitutional: She appears well-developed and well-nourished.  HENT:  Head: Normocephalic.  Right Ear: External ear normal.  Left Ear: External ear normal.  Eyes: Conjunctivae are normal. Pupils are equal, round, and reactive to light.  Cardiovascular: Normal rate.   Pulmonary/Chest: Effort normal and breath sounds normal.  Abdominal: Soft. Bowel sounds are normal.  Genitourinary: Vaginal discharge found.  Thick white discharge  Musculoskeletal: Normal range of motion.  Neurological: She is alert.  Skin: Skin is warm.  Psychiatric: She has a normal mood and affect.    ED Course  Procedures (including critical care time)  Labs Reviewed  URINALYSIS, ROUTINE W REFLEX MICROSCOPIC - Abnormal; Notable for the following:    Specific Gravity, Urine 1.003 (*)    All other components within normal limits  WET PREP, GENITAL  GC/CHLAMYDIA PROBE AMP  PREGNANCY, URINE   No results found.   No diagnosis found.    MDM   Results for orders placed during the hospital encounter of 09/05/12  WET PREP, GENITAL      Result Value Range   Yeast Wet Prep HPF POC NONE SEEN  NONE SEEN   Trich, Wet Prep  NONE SEEN  NONE SEEN   Clue Cells Wet Prep HPF POC FEW (*) NONE SEEN   WBC, Wet Prep HPF POC FEW (*) NONE SEEN  URINALYSIS, ROUTINE W REFLEX MICROSCOPIC      Result Value Range   Color, Urine YELLOW  YELLOW   APPearance CLEAR  CLEAR   Specific Gravity, Urine 1.003 (*) 1.005 - 1.030   pH 6.5  5.0 - 8.0   Glucose, UA NEGATIVE  NEGATIVE mg/dL   Hgb urine dipstick NEGATIVE  NEGATIVE   Bilirubin Urine NEGATIVE  NEGATIVE   Ketones, ur NEGATIVE  NEGATIVE mg/dL   Protein, ur NEGATIVE  NEGATIVE mg/dL   Urobilinogen, UA 0.2  0.0 - 1.0 mg/dL   Nitrite NEGATIVE  NEGATIVE   Leukocytes, UA NEGATIVE  NEGATIVE  PREGNANCY, URINE      Result Value Range   Preg Test, Ur NEGATIVE  NEGATIVE   No results found.         Lonia Skinner [place], PA-C 09/05/12 1437

## 2012-09-06 LAB — GC/CHLAMYDIA PROBE AMP: GC Probe RNA: NEGATIVE

## 2012-09-23 ENCOUNTER — Encounter (HOSPITAL_BASED_OUTPATIENT_CLINIC_OR_DEPARTMENT_OTHER): Payer: Self-pay

## 2012-09-23 ENCOUNTER — Emergency Department (HOSPITAL_BASED_OUTPATIENT_CLINIC_OR_DEPARTMENT_OTHER)
Admission: EM | Admit: 2012-09-23 | Discharge: 2012-09-23 | Disposition: A | Discharge status: Home / Self Care | Payer: BC Managed Care – PPO | Attending: Emergency Medicine | Admitting: Emergency Medicine

## 2012-09-23 DIAGNOSIS — J309 Allergic rhinitis, unspecified: Secondary | ICD-10-CM

## 2012-09-23 DIAGNOSIS — Z8619 Personal history of other infectious and parasitic diseases: Secondary | ICD-10-CM | POA: Insufficient documentation

## 2012-09-23 DIAGNOSIS — F17200 Nicotine dependence, unspecified, uncomplicated: Secondary | ICD-10-CM | POA: Insufficient documentation

## 2012-09-23 DIAGNOSIS — R059 Cough, unspecified: Secondary | ICD-10-CM | POA: Insufficient documentation

## 2012-09-23 DIAGNOSIS — R6889 Other general symptoms and signs: Secondary | ICD-10-CM | POA: Insufficient documentation

## 2012-09-23 DIAGNOSIS — R05 Cough: Secondary | ICD-10-CM | POA: Insufficient documentation

## 2012-09-23 DIAGNOSIS — J3489 Other specified disorders of nose and nasal sinuses: Secondary | ICD-10-CM | POA: Insufficient documentation

## 2012-09-23 DIAGNOSIS — Z8742 Personal history of other diseases of the female genital tract: Secondary | ICD-10-CM | POA: Insufficient documentation

## 2012-09-23 MED ORDER — FLUTICASONE PROPIONATE 50 MCG/ACT NA SUSP: 2.0000 | Freq: Every day | NASAL | Status: DC

## 2012-09-23 NOTE — ED Notes (Signed)
Pt reports nasal congestion, sneezing and cough x 2 days unrelieved after taking OTC medications.

## 2012-09-23 NOTE — ED Provider Notes (Signed)
History     CSN: [id number]  Arrival date & time 09/23/12  1324   First MD Initiated Contact with Patient 09/23/12 [phone]      Chief Complaint  Patient presents with  . Nasal Congestion  . Cough     HPI Pt reports nasal congestion, sneezing and cough x 2 days unrelieved after taking OTC medications.       Past Medical History  Diagnosis Date  . Bacterial vaginosis   . Chlamydia   . Trichomonas     History reviewed. No pertinent past surgical history.  History reviewed. No pertinent family history.  History  Substance Use Topics  . Smoking status: Current Some Day Smoker    Types: Cigarettes  . Smokeless tobacco: Never Used  . Alcohol Use: Yes     Comment: occ    OB History   Grav Para Term Preterm Abortions TAB SAB Ect Mult Living                  Review of Systems  All other systems reviewed and are negative.    Allergies  Review of patient's allergies indicates no known allergies.  Home Medications   Current Outpatient Rx  Name  Route  Sig  Dispense  Refill  . fluticasone (FLONASE) 50 MCG/ACT nasal spray   Nasal   Place 2 sprays into the nose daily.   16 g   2     BP 117/81  Pulse 82  Temp(Src) 98.1 F (36.7 C) (Oral)  Resp 18  Ht 4' 11.5" (1.511 m)  Wt 120 lb (54.432 kg)  BMI 23.84 kg/m2  SpO2 100%  LMP 08/23/2012  Physical Exam  Nursing note and vitals reviewed. Constitutional: She is oriented to person, place, and time. She appears well-developed and well-nourished. No distress.  HENT:  Head: Normocephalic and atraumatic.  Nose: Mucosal edema present. No nasal deformity or septal deviation. No epistaxis.  Eyes: Pupils are equal, round, and reactive to light.  Neck: Normal range of motion.  Cardiovascular: Normal rate and intact distal pulses.   Pulmonary/Chest: No respiratory distress. She has no wheezes. She has no rales.  Abdominal: Normal appearance. She exhibits no distension.  Musculoskeletal: Normal range of motion.   Neurological: She is alert and oriented to person, place, and time. No cranial nerve deficit.  Skin: Skin is warm and dry. No rash noted.  Psychiatric: She has a normal mood and affect. Her behavior is normal.    ED Course  Procedures (including critical care time)  Labs Reviewed - No data to display No results found.   1. Allergic rhinitis       MDM         Nelia Shi, MD 09/23/12 [phone]

## 2012-10-24 ENCOUNTER — Emergency Department (HOSPITAL_BASED_OUTPATIENT_CLINIC_OR_DEPARTMENT_OTHER)
Admission: EM | Admit: 2012-10-24 | Discharge: 2012-10-24 | Disposition: A | Discharge status: Home / Self Care | Payer: BC Managed Care – PPO | Attending: Emergency Medicine | Admitting: Emergency Medicine

## 2012-10-24 ENCOUNTER — Encounter (HOSPITAL_BASED_OUTPATIENT_CLINIC_OR_DEPARTMENT_OTHER): Payer: Self-pay | Admitting: *Deleted

## 2012-10-24 ENCOUNTER — Emergency Department (HOSPITAL_BASED_OUTPATIENT_CLINIC_OR_DEPARTMENT_OTHER): Payer: BC Managed Care – PPO

## 2012-10-24 DIAGNOSIS — Y9289 Other specified places as the place of occurrence of the external cause: Secondary | ICD-10-CM | POA: Insufficient documentation

## 2012-10-24 DIAGNOSIS — IMO0002 Reserved for concepts with insufficient information to code with codable children: Secondary | ICD-10-CM | POA: Insufficient documentation

## 2012-10-24 DIAGNOSIS — Z8742 Personal history of other diseases of the female genital tract: Secondary | ICD-10-CM | POA: Insufficient documentation

## 2012-10-24 DIAGNOSIS — Y9389 Activity, other specified: Secondary | ICD-10-CM | POA: Insufficient documentation

## 2012-10-24 DIAGNOSIS — S161XXA Strain of muscle, fascia and tendon at neck level, initial encounter: Secondary | ICD-10-CM

## 2012-10-24 DIAGNOSIS — F17200 Nicotine dependence, unspecified, uncomplicated: Secondary | ICD-10-CM | POA: Insufficient documentation

## 2012-10-24 DIAGNOSIS — Z8619 Personal history of other infectious and parasitic diseases: Secondary | ICD-10-CM | POA: Insufficient documentation

## 2012-10-24 DIAGNOSIS — S139XXA Sprain of joints and ligaments of unspecified parts of neck, initial encounter: Secondary | ICD-10-CM | POA: Insufficient documentation

## 2012-10-24 MED ORDER — HYDROCODONE-ACETAMINOPHEN 5-325 MG PO TABS: 1.0000 | ORAL_TABLET | ORAL | Status: DC | PRN

## 2012-10-24 MED ORDER — CYCLOBENZAPRINE HCL 5 MG PO TABS: 5.0000 mg | ORAL_TABLET | Freq: Three times a day (TID) | ORAL | Status: DC | PRN
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
## 2013-02-26 ENCOUNTER — Inpatient Hospital Stay (HOSPITAL_COMMUNITY)
Admission: AD | Admit: 2013-02-26 | Discharge: 2013-02-26 | Discharge status: Against Medical Advice | Payer: BC Managed Care – PPO | Source: Ambulatory Visit | Attending: Obstetrics and Gynecology | Admitting: Obstetrics and Gynecology

## 2013-02-26 DIAGNOSIS — L293 Anogenital pruritus, unspecified: Secondary | ICD-10-CM | POA: Insufficient documentation

## 2013-02-26 LAB — URINALYSIS, ROUTINE W REFLEX MICROSCOPIC
Glucose, UA: NEGATIVE mg/dL
Leukocytes, UA: NEGATIVE
Nitrite: NEGATIVE
Specific Gravity, Urine: 1.015 (ref 1.005–1.030)
pH: 6 (ref 5.0–8.0)

## 2013-02-26 LAB — POCT PREGNANCY, URINE: Preg Test, Ur: NEGATIVE

## 2013-02-26 NOTE — MAU Note (Signed)
Patient states she has a vaginal itch with a fishy smell for a couple of days. Denies discharge or bleeding, no pain.

## 2013-02-26 NOTE — MAU Note (Signed)
Not in lobby

## 2013-02-26 NOTE — MAU Note (Signed)
Patient is not in the lobby when called to a room. Registration stated the patient told them she had been waiting and was leaving with her friend.

## 2013-05-09 ENCOUNTER — Encounter (HOSPITAL_COMMUNITY): Payer: Self-pay | Admitting: *Deleted

## 2013-05-09 ENCOUNTER — Inpatient Hospital Stay (HOSPITAL_COMMUNITY)
Admission: AD | Admit: 2013-05-09 | Discharge: 2013-05-09 | Disposition: A | Discharge status: Home / Self Care | Payer: Self-pay | Source: Ambulatory Visit | Attending: Obstetrics and Gynecology | Admitting: Obstetrics and Gynecology

## 2013-05-09 DIAGNOSIS — N899 Noninflammatory disorder of vagina, unspecified: Secondary | ICD-10-CM | POA: Insufficient documentation

## 2013-05-09 DIAGNOSIS — A499 Bacterial infection, unspecified: Secondary | ICD-10-CM

## 2013-05-09 DIAGNOSIS — N760 Acute vaginitis: Secondary | ICD-10-CM

## 2013-05-09 DIAGNOSIS — B9689 Other specified bacterial agents as the cause of diseases classified elsewhere: Secondary | ICD-10-CM

## 2013-05-09 DIAGNOSIS — F17200 Nicotine dependence, unspecified, uncomplicated: Secondary | ICD-10-CM | POA: Insufficient documentation

## 2013-05-09 LAB — WET PREP, GENITAL
Clue Cells Wet Prep HPF POC: NONE SEEN
Trich, Wet Prep: NONE SEEN
Yeast Wet Prep HPF POC: NONE SEEN

## 2013-05-09 MED ORDER — METRONIDAZOLE 500 MG PO TABS: 500.0000 mg | ORAL_TABLET | Freq: Two times a day (BID) | ORAL | Status: DC

## 2013-05-09 NOTE — Discharge Instructions (Signed)

## 2013-05-09 NOTE — MAU Note (Signed)
Woke up with an odor this morning.  Had an abortion on Sat (in [place]), don't know if it caused it.

## 2013-05-09 NOTE — MAU Provider Note (Signed)
@  MAUPATCONTACT@  Chief Complaint:  vag odor    Tammy Gardner is  23 y.o. G1P0010  presents complaining of vag odor  . She states she first noticed the odor today morning, and it smelled similar to when she had a previous bacterial infection. Pt has a recent hx of surgical TAB performed on Saturday, she has been bleeding on and off since then(currently bleeding) Denies any abdominal pain/cramping or vaginal itching Obstetrical/Gynecological History: OB History   Grav Para Term Preterm Abortions TAB SAB Ect Mult Living   1 0 0 0 1 1 0 0 0 0      Past Medical History: Past Medical History  Diagnosis Date  . Bacterial vaginosis   . Chlamydia   . Trichomonas     Past Surgical History: Past Surgical History  Procedure Laterality Date  . Termination    . Induced abortion      Family History: History reviewed. No pertinent family history.  Social History: History  Substance Use Topics  . Smoking status: Current Some Day Smoker    Types: Cigarettes  . Smokeless tobacco: Never Used  . Alcohol Use: Yes     Comment: occ    Allergies: No Known Allergies  Meds:  No prescriptions prior to admission    Review of Systems -   Review of Systems  Constitutional: Negative for fever Gastrointestinal: Negative for abdominal pain heartburn, nausea, vomiting, diarrhea, constipation Genitourinary: Negative for dysuria, urgency, frequency   Physical Exam  Blood pressure 114/63, pulse 79, temperature 98.3 F (36.8 C), temperature source Oral, resp. rate 18, height 4\' 11"  (1.499 m), weight 52.617 kg (116 lb), last menstrual period 03/12/2013. GENERAL: Well-developed, well-nourished female in no acute distress.  SSE: No gross abnormalities, blood present, mild odor perceived  Labs: Results for orders placed during the hospital encounter of 05/09/13 (from the past 24 hour(s))  WET PREP, GENITAL   Collection Time    05/09/13  2:05 PM      Result Value Range   Yeast Wet Prep HPF  POC NONE SEEN  NONE SEEN   Trich, Wet Prep NONE SEEN  NONE SEEN   Clue Cells Wet Prep HPF POC NONE SEEN  NONE SEEN   WBC, Wet Prep HPF POC FEW (*) NONE SEEN   Imaging Studies:  No results found.  Assessment: Tammy Gardner is  23 y.o. G1P0010 presenting with a chief complaint of vaginal odor. History and SSE exam suggestive of Bacterial Vaginosis  Plan: Metronidazole 500mg  BID * 7 days  Sallyanne HaversMuazu, Aisha 2/4/20152:59 PM   I have seen this patient and agree with the above student's note.  LEFTWICH-KIRBY, Audrianna Driskill Certified Nurse-Midwife

## 2013-05-10 LAB — GC/CHLAMYDIA PROBE AMP
CT PROBE, AMP APTIMA: NEGATIVE
GC Probe RNA: NEGATIVE

## 2013-05-10 NOTE — MAU Provider Note (Signed)
Attestation of Attending Supervision of Advanced Practitioner (CNM/NP): Evaluation and management procedures were performed by the Advanced Practitioner under my supervision and collaboration.  I have reviewed the Advanced Practitioner's note and chart, and I agree with the management and plan.  Tiondra Fang 05/10/2013 8:35 AM

## 2013-08-07 ENCOUNTER — Emergency Department (INDEPENDENT_AMBULATORY_CARE_PROVIDER_SITE_OTHER)
Admission: EM | Admit: 2013-08-07 | Discharge: 2013-08-07 | Disposition: A | Discharge status: Home / Self Care | Payer: Self-pay | Source: Home / Self Care | Attending: Emergency Medicine | Admitting: Emergency Medicine

## 2013-08-07 ENCOUNTER — Encounter (HOSPITAL_COMMUNITY): Payer: Self-pay | Admitting: Emergency Medicine

## 2013-08-07 ENCOUNTER — Other Ambulatory Visit (HOSPITAL_COMMUNITY)
Admission: RE | Admit: 2013-08-07 | Discharge: 2013-08-07 | Disposition: A | Discharge status: Home / Self Care | Payer: Self-pay | Source: Ambulatory Visit | Attending: Emergency Medicine | Admitting: Emergency Medicine

## 2013-08-07 DIAGNOSIS — A499 Bacterial infection, unspecified: Secondary | ICD-10-CM

## 2013-08-07 DIAGNOSIS — R102 Pelvic and perineal pain unspecified side: Secondary | ICD-10-CM

## 2013-08-07 DIAGNOSIS — N898 Other specified noninflammatory disorders of vagina: Secondary | ICD-10-CM

## 2013-08-07 DIAGNOSIS — Z113 Encounter for screening for infections with a predominantly sexual mode of transmission: Secondary | ICD-10-CM | POA: Insufficient documentation

## 2013-08-07 DIAGNOSIS — N939 Abnormal uterine and vaginal bleeding, unspecified: Secondary | ICD-10-CM

## 2013-08-07 DIAGNOSIS — B9689 Other specified bacterial agents as the cause of diseases classified elsewhere: Secondary | ICD-10-CM

## 2013-08-07 DIAGNOSIS — N760 Acute vaginitis: Secondary | ICD-10-CM | POA: Insufficient documentation

## 2013-08-07 LAB — POCT URINALYSIS DIP (DEVICE)
Bilirubin Urine: NEGATIVE
Glucose, UA: NEGATIVE mg/dL
Ketones, ur: NEGATIVE mg/dL
Nitrite: NEGATIVE
PH: 6 (ref 5.0–8.0)
PROTEIN: NEGATIVE mg/dL
Specific Gravity, Urine: 1.01 (ref 1.005–1.030)
UROBILINOGEN UA: 0.2 mg/dL (ref 0.0–1.0)

## 2013-08-07 LAB — POCT PREGNANCY, URINE: PREG TEST UR: NEGATIVE

## 2013-08-07 MED ORDER — METRONIDAZOLE 500 MG PO TABS: 500.0000 mg | ORAL_TABLET | Freq: Two times a day (BID) | ORAL | Status: DC

## 2013-08-07 NOTE — ED Notes (Signed)
Call back number verified.  

## 2013-08-07 NOTE — Discharge Instructions (Signed)
Abnormal Uterine Bleeding °Abnormal uterine bleeding means bleeding from the vagina that is not your normal menstrual period. This can be: °· Bleeding or spotting between periods. °· Bleeding after sex (sexual intercourse). °· Bleeding that is heavier or more than normal. °· Periods that last longer than usual. °· Bleeding after menopause. °There are many problems that may cause this. Treatment will depend on the cause of the bleeding. Any kind of bleeding that is not normal should be reviewed by your doctor.  °HOME CARE °Watch your condition for any changes. These actions may lessen any discomfort you are having: °· Do not use tampons or douches as told by your doctor. °· Change your pads often. °You should get regular pelvic exams and Pap tests. Keep all appointments for tests as told by your doctor. °GET HELP IF: °· You are bleeding for more than 1 week. °· You feel dizzy at times. °GET HELP RIGHT AWAY IF:  °· You pass out. °· You have to change pads every 15 to 30 minutes. °· You have belly pain. °· You have a fever. °· You become sweaty or weak. °· You are passing large blood clots from the vagina. °· You feel sick to your stomach (nauseous) and throw up (vomit). °MAKE SURE YOU: °· Understand these instructions. °· Will watch your condition. °· Will get help right away if you are not doing well or get worse. °Document Released: 01/17/2009 Document Revised: 01/10/2013 Document Reviewed: 10/19/2012 °ExitCare® Patient Information ©2014 [employer]. ° °Bacterial Vaginosis °Bacterial vaginosis is a vaginal infection that occurs when the normal balance of bacteria in the vagina is disrupted. It results from an overgrowth of certain bacteria. This is the most common vaginal infection in women of childbearing age. Treatment is important to prevent complications, especially in pregnant women, as it can cause a premature delivery. °CAUSES  °Bacterial vaginosis is caused by an increase in harmful bacteria that are  normally present in smaller amounts in the vagina. Several different kinds of bacteria can cause bacterial vaginosis. However, the reason that the condition develops is not fully understood. °RISK FACTORS °Certain activities or behaviors can put you at an increased risk of developing bacterial vaginosis, including: °· Having a new sex partner or multiple sex partners. °· Douching. °· Using an intrauterine device (IUD) for contraception. °Women do not get bacterial vaginosis from toilet seats, bedding, swimming pools, or contact with objects around them. °SIGNS AND SYMPTOMS  °Some women with bacterial vaginosis have no signs or symptoms. Common symptoms include: °· Grey vaginal discharge. °· A fishlike odor with discharge, especially after sexual intercourse. °· Itching or burning of the vagina and vulva. °· Burning or pain with urination. °DIAGNOSIS  °Your health care provider will take a medical history and examine the vagina for signs of bacterial vaginosis. A sample of vaginal fluid may be taken. Your health care provider will look at this sample under a microscope to check for bacteria and abnormal cells. A vaginal pH test may also be done.  °TREATMENT  °Bacterial vaginosis may be treated with antibiotic medicines. These may be given in the form of a pill or a vaginal cream. A second round of antibiotics may be prescribed if the condition comes back after treatment.  °HOME CARE INSTRUCTIONS  °· Only take over-the-counter or prescription medicines as directed by your health care provider. °· If antibiotic medicine was prescribed, take it as directed. Make sure you finish it even if you start to feel better. °· Do not   have sex until treatment is completed. °· Tell all sexual partners that you have a vaginal infection. They should see their health care provider and be treated if they have problems, such as a mild rash or itching. °· Practice safe sex by using condoms and only having one sex partner. °SEEK MEDICAL  CARE IF:  °· Your symptoms are not improving after 3 days of treatment. °· You have increased discharge or pain. °· You have a fever. °MAKE SURE YOU:  °· Understand these instructions. °· Will watch your condition. °· Will get help right away if you are not doing well or get worse. °FOR MORE INFORMATION  °Centers for Disease Control and Prevention, Division of STD Prevention: www.cdc.gov/std °American Sexual Health Association (ASHA): www.ashastd.org  °Document Released: 03/22/2005 Document Revised: 01/10/2013 Document Reviewed: 11/01/2012 °ExitCare® Patient Information ©2014 [employer]. ° °

## 2013-08-07 NOTE — ED Provider Notes (Signed)
Chief Complaint   Chief Complaint  Patient presents with  . Vaginal Discharge    History of Present Illness   Tammy Gardner is a 23 year old female who has had a one-week history of vaginal discharge and a two-day history of odor. She denies any itching, vulvar, or vaginal pain. Today she has some spotting bleeding. Her last menstrual period was mid April and lasted 3 days. She has some pain in her right lower back and right pelvis area ever since an abortion last January. The pain is infrequent occurring every 2-3 days. It lasts from 10 seconds to up to an hour at a time. It comes and goes and is sharp in nature. She's had slight urinary frequency but denies any dysuria, urgency, or hematuria. No fever, chills, nausea, or vomiting.  Review of Systems   Other than as noted above, the patient denies any of the following symptoms: Systemic:  No fever or chills GI:  No abdominal pain, nausea, vomiting, diarrhea, constipation, melena or hematochezia. GU:  No dysuria, frequency, urgency, hematuria, vaginal discharge, itching, or abnormal vaginal bleeding.  PMFSH   Past medical history, family history, social history, meds, and allergies were reviewed.    Physical Examination    Vital signs:  BP 129/81  Pulse 60  Temp(Src) 98.8 F (37.1 C) (Oral)  Resp 18  SpO2 100%  LMP 07/07/2013 General:  Alert, oriented and in no distress. Lungs:  Breath sounds clear and equal bilaterally.  No wheezes, rales or rhonchi. Heart:  Regular rhythm.  No gallops or murmers. Abdomen:  Soft, flat and non-distended.  No organomegaly or mass.  No tenderness, guarding or rebound.  Bowel sounds normally active. Pelvic exam:  Normal external genitalia. Vaginal and cervical mucosa were normal. There was a heavy, brownish, malodorous vaginal discharge. She has mild cervical motion pain. Uterus is normal in size and shape and nontender. No adnexal masses or tenderness.  DNA probes for gonorrhea, Chlamydia,  Trichomonas, Gardnerella, Candida were obtained. Skin:  Clear, warm and dry.  Labs   Results for orders placed during the hospital encounter of 08/07/13  POCT URINALYSIS DIP (DEVICE)      Result Value Ref Range   Glucose, UA NEGATIVE  NEGATIVE mg/dL   Bilirubin Urine NEGATIVE  NEGATIVE   Ketones, ur NEGATIVE  NEGATIVE mg/dL   Specific Gravity, Urine 1.010  1.005 - 1.030   Hgb urine dipstick LARGE (*) NEGATIVE   pH 6.0  5.0 - 8.0   Protein, ur NEGATIVE  NEGATIVE mg/dL   Urobilinogen, UA 0.2  0.0 - 1.0 mg/dL   Nitrite NEGATIVE  NEGATIVE   Leukocytes, UA MODERATE (*) NEGATIVE  POCT PREGNANCY, URINE      Result Value Ref Range   Preg Test, Ur NEGATIVE  NEGATIVE    Assessment   The primary encounter diagnosis was Bacterial vaginosis. Diagnoses of Abnormal vaginal bleeding and Pelvic pain were also pertinent to this visit.  She's not very tender on her pelvic exam, so I don't think she has PID. I'm not sure of the cause of her chronic right-sided lower back and lower abdominal pain since her abortion last January. I think she needs followup with GYN and will need an ultrasound. She'll need to be treated for her BV. Caused her spotting also remains unknown although could be just dysfunctional uterine bleeding.       Plan    1.  Meds:  The following meds were prescribed:   Discharge Medication List as of 08/07/2013  5:34 PM    START taking these medications   Details  !! metroNIDAZOLE (FLAGYL) 500 MG tablet Take 1 tablet (500 mg total) by mouth 2 (two) times daily., Starting 08/07/2013, Until Discontinued, Normal     !! - Potential duplicate medications found. Please discuss with provider.      2.  Patient Education/Counseling:  The patient was given appropriate handouts, self care instructions, and instructed in symptomatic relief.    3.  Follow up:  The patient was told to follow up here if no better in 3 to 4 days, or sooner if becoming worse in any way, and given some red flag  symptoms such as worsening pain, fever, persistent vomiting, or heavy vaginal bleeding which would prompt immediate return. [facility] 2 weeks.     Reuben Likesavid C Crecencio Kwiatek, MD 08/07/13 [phone]

## 2013-08-07 NOTE — ED Notes (Signed)
Pt c/o white vag d/c onset 2 weeks Hx of freq BV Sx also include abd pain Alert w/no signs of acute distress.

## 2013-08-08 LAB — CERVICOVAGINAL ANCILLARY ONLY
CHLAMYDIA, DNA PROBE: NEGATIVE
NEISSERIA GONORRHEA: NEGATIVE
Wet Prep (BD Affirm): NEGATIVE
Wet Prep (BD Affirm): POSITIVE — AB
Wet Prep (BD Affirm): POSITIVE — AB

## 2013-08-09 NOTE — ED Notes (Signed)
GC/Chlamydia neg., Affirm: Candida and Gardnerella pos., Trich neg.  Pt. adequately treated for bacterial vaginosis with Flagyl.  Message to Dr. Lorenz CoasterKeller. Desiree LucySuzanne M Draydon [place] 08/09/2013

## 2013-08-10 ENCOUNTER — Telehealth (HOSPITAL_COMMUNITY): Payer: Self-pay | Admitting: Emergency Medicine

## 2013-08-10 MED ORDER — FLUCONAZOLE 150 MG PO TABS: 150.0000 mg | ORAL_TABLET | Freq: Once | ORAL | Status: DC

## 2013-08-10 NOTE — ED Notes (Signed)
Her DNA probes came back positive for Candida and Gardnerella. She was treated with Flagyl. Will need treatment with Diflucan 150 mg, number one, take one tablet one time only to be sent in electronically to her pharmacy, [facility] [address]. We will need to call and inform the patient.  Reuben Likesavid C Merlen Gurry, MD 08/10/13 [phone]

## 2013-08-11 ENCOUNTER — Telehealth (HOSPITAL_COMMUNITY): Payer: Self-pay | Admitting: *Deleted

## 2013-08-11 NOTE — ED Notes (Signed)
I called pt. Pt. verified x 2 and given results.  Pt. told she was adequately treated with Flagyl for bacterial vaginosis and needs Diflucan x 1 for the yeast infection.  Pt. told where she can pick up her Rx.  Pt. voiced understanding. Tammy Gardner 08/11/2013

## 2013-12-12 ENCOUNTER — Other Ambulatory Visit (HOSPITAL_COMMUNITY)
Admission: RE | Admit: 2013-12-12 | Discharge: 2013-12-12 | Disposition: A | Discharge status: Home / Self Care | Payer: Self-pay | Source: Ambulatory Visit | Attending: Emergency Medicine | Admitting: Emergency Medicine

## 2013-12-12 ENCOUNTER — Emergency Department (INDEPENDENT_AMBULATORY_CARE_PROVIDER_SITE_OTHER)
Admission: EM | Admit: 2013-12-12 | Discharge: 2013-12-12 | Disposition: A | Discharge status: Home / Self Care | Payer: Self-pay | Source: Home / Self Care | Attending: Emergency Medicine | Admitting: Emergency Medicine

## 2013-12-12 ENCOUNTER — Encounter (HOSPITAL_COMMUNITY): Payer: Self-pay | Admitting: Emergency Medicine

## 2013-12-12 DIAGNOSIS — N760 Acute vaginitis: Secondary | ICD-10-CM

## 2013-12-12 DIAGNOSIS — B9689 Other specified bacterial agents as the cause of diseases classified elsewhere: Secondary | ICD-10-CM

## 2013-12-12 DIAGNOSIS — A499 Bacterial infection, unspecified: Secondary | ICD-10-CM

## 2013-12-12 DIAGNOSIS — Z113 Encounter for screening for infections with a predominantly sexual mode of transmission: Secondary | ICD-10-CM | POA: Insufficient documentation

## 2013-12-12 LAB — POCT URINALYSIS DIP (DEVICE)
Bilirubin Urine: NEGATIVE
GLUCOSE, UA: NEGATIVE mg/dL
HGB URINE DIPSTICK: NEGATIVE
Ketones, ur: NEGATIVE mg/dL
Leukocytes, UA: NEGATIVE
Nitrite: NEGATIVE
Protein, ur: NEGATIVE mg/dL
SPECIFIC GRAVITY, URINE: 1.02 (ref 1.005–1.030)
UROBILINOGEN UA: 1 mg/dL (ref 0.0–1.0)
pH: 7 (ref 5.0–8.0)

## 2013-12-12 LAB — POCT PREGNANCY, URINE: Preg Test, Ur: NEGATIVE

## 2013-12-12 MED ORDER — METRONIDAZOLE 500 MG PO TABS: 500.0000 mg | ORAL_TABLET | Freq: Two times a day (BID) | ORAL | Status: DC

## 2013-12-12 NOTE — ED Provider Notes (Signed)
Chief Complaint   Chief Complaint  Patient presents with  . Urinary Frequency    History of Present Illness   Tammy Gardner is a 23 year old female who has had a one-week history of urinary frequency, urgency, but no dysuria or hematuria. She's also had some vaginal discharge but denies any itching or odor. She's had no pelvic or lower back pain. No fever, chills, nausea, or vomiting. She has a history of atrial vaginosis in the past. She also has a history of urinary tract infection about a year ago. She is on birth control pills and is sexually active.  Review of Systems   Other than as noted above, the patient denies any of the following symptoms: General:  No fevers or chills. GI:  No abdominal pain, back pain, nausea, or vomiting. GU:  No hematuria or incontinence. GYN:  No discharge, itching, vulvar pain or lesions, pelvic pain, or abnormal vaginal bleeding.  PMFSH   Past medical history, family history, social history, meds, and allergies were reviewed.    Physical Examination     Vital signs:  BP 127/85  Pulse 62  Temp(Src) 98.7 F (37.1 C) (Oral)  Resp 16  SpO2 100%  LMP 11/23/2013 Gen:  Alert, oriented, in no distress. Lungs:  Clear to auscultation, no wheezes, rales or rhonchi. Heart:  Regular rhythm, no gallop or murmer. Abdomen:  Flat and soft. There was slight suprapubic pain to palpation.  No guarding, or rebound.  No hepato-splenomegaly or mass.  Bowel sounds were normally active.  No hernia. Pelvic exam:  Normal external genitalia. Vaginal and cervical mucosa were normal. There was a scant amount of white discharge. No bleeding. Vaginal and cervical mucosa were normal. No pain on cervical motion. Uterus was normal in size and shape and nontender. No adnexal masses or tenderness.  DNA probes for gonorrhea, Chlamydia, Trichomonas, Gardnerella, and Candida were obtained. Back:  No CVA tenderness.  Skin:  Clear, warm and dry.  Labs   Results for orders  placed during the hospital encounter of 12/12/13  POCT URINALYSIS DIP (DEVICE)      Result Value Ref Range   Glucose, UA NEGATIVE  NEGATIVE mg/dL   Bilirubin Urine NEGATIVE  NEGATIVE   Ketones, ur NEGATIVE  NEGATIVE mg/dL   Specific Gravity, Urine 1.020  1.005 - 1.030   Hgb urine dipstick NEGATIVE  NEGATIVE   pH 7.0  5.0 - 8.0   Protein, ur NEGATIVE  NEGATIVE mg/dL   Urobilinogen, UA 1.0  0.0 - 1.0 mg/dL   Nitrite NEGATIVE  NEGATIVE   Leukocytes, UA NEGATIVE  NEGATIVE  POCT PREGNANCY, URINE      Result Value Ref Range   Preg Test, Ur NEGATIVE  NEGATIVE     A urine culture was obtained.  Results are pending at this time and we will call about any positive results.  Assessment   The encounter diagnosis was Bacterial vaginosis.   No evidence of pyelonephritis.  There is no evidence of urinary tract infection at this time. Her symptoms appear to be all due to vaginitis.  Plan   1.  Meds:  The following meds were prescribed:   Discharge Medication List as of 12/12/2013  9:05 AM    START taking these medications   Details  !! metroNIDAZOLE (FLAGYL) 500 MG tablet Take 1 tablet (500 mg total) by mouth 2 (two) times daily., Starting 12/12/2013, Until Discontinued, Normal     !! - Potential duplicate medications found. Please discuss with provider.  2.  Patient Education/Counseling:  The patient was given appropriate handouts, self care instructions, and instructed in symptomatic relief. The patient was told to avoid intercourse for 10 days, get extra fluids, and return for a follow up with her primary care doctor at the completion of treatment for a repeat UA and culture.    3.  Follow up:  The patient was told to follow up here if no better in 3 to 4 days, or sooner if becoming worse in any way, and given some red flag symptoms such as fever, persistent vomiting, or severe flank or abdominal pain which would prompt immediate return.     Reuben Likes, MD 12/12/13 [phone]

## 2013-12-12 NOTE — Discharge Instructions (Signed)
Bacterial Vaginosis Bacterial vaginosis is a vaginal infection that occurs when the normal balance of bacteria in the vagina is disrupted. It results from an overgrowth of certain bacteria. This is the most common vaginal infection in women of childbearing age. Treatment is important to prevent complications, especially in pregnant women, as it can cause a premature delivery. CAUSES  Bacterial vaginosis is caused by an increase in harmful bacteria that are normally present in smaller amounts in the vagina. Several different kinds of bacteria can cause bacterial vaginosis. However, the reason that the condition develops is not fully understood. RISK FACTORS Certain activities or behaviors can put you at an increased risk of developing bacterial vaginosis, including:  Having a new sex partner or multiple sex partners.  Douching.  Using an intrauterine device (IUD) for contraception. Women do not get bacterial vaginosis from toilet seats, bedding, swimming pools, or contact with objects around them. SIGNS AND SYMPTOMS  Some women with bacterial vaginosis have no signs or symptoms. Common symptoms include:  Grey vaginal discharge.  A fishlike odor with discharge, especially after sexual intercourse.  Itching or burning of the vagina and vulva.  Burning or pain with urination. DIAGNOSIS  Your health care provider will take a medical history and examine the vagina for signs of bacterial vaginosis. A sample of vaginal fluid may be taken. Your health care provider will look at this sample under a microscope to check for bacteria and abnormal cells. A vaginal pH test may also be done.  TREATMENT  Bacterial vaginosis may be treated with antibiotic medicines. These may be given in the form of a pill or a vaginal cream. A second round of antibiotics may be prescribed if the condition comes back after treatment.  HOME CARE INSTRUCTIONS   Only take over-the-counter or prescription medicines as  directed by your health care provider.  If antibiotic medicine was prescribed, take it as directed. Make sure you finish it even if you start to feel better.  Do not have sex until treatment is completed.  Tell all sexual partners that you have a vaginal infection. They should see their health care provider and be treated if they have problems, such as a mild rash or itching.  Practice safe sex by using condoms and only having one sex partner. SEEK MEDICAL CARE IF:   Your symptoms are not improving after 3 days of treatment.  You have increased discharge or pain.  You have a fever. MAKE SURE YOU:   Understand these instructions.  Will watch your condition.  Will get help right away if you are not doing well or get worse. FOR MORE INFORMATION  Centers for Disease Control and Prevention, Division of STD Prevention: www.cdc.gov/std American Sexual Health Association (ASHA): www.ashastd.org  Document Released: 03/22/2005 Document Revised: 01/10/2013 Document Reviewed: 11/01/2012 ExitCare Patient Information 2015 [employer]. This information is not intended to replace advice given to you by your health care provider. Make sure you discuss any questions you have with your health care provider.  

## 2013-12-12 NOTE — ED Notes (Signed)
Pt  Reports   Symptoms  Of   Urinary  Frequency        scanty  Urine  Output       With  Symptoms  X  1  Week     -  She  Reports  As  Well    A  Slight vaginal  Discharge         And  Has  Had  BV  In past

## 2013-12-13 ENCOUNTER — Telehealth (HOSPITAL_COMMUNITY): Payer: Self-pay | Admitting: Emergency Medicine

## 2013-12-13 MED ORDER — CEPHALEXIN 500 MG PO CAPS: 500.0000 mg | ORAL_CAPSULE | Freq: Three times a day (TID) | ORAL | Status: DC

## 2013-12-13 NOTE — ED Notes (Signed)
Her urine culture is growing out greater than 100,000 colonies of Escherichia coli. She was treated for bacterial vaginosis with metronidazole and was not treated for urinary tract infection. I will send in a prescription for cephalexin for her since she has no known allergies with the dosage of 500 mg 3 times a day for 10 days to her pharmacy, the [facility] in [address]. We will need to call her and inform her of the [place] and the need to finish up the antibiotics completely.  Reuben Likes, MD 12/13/13 [phone]

## 2013-12-14 LAB — URINE CULTURE
Colony Count: >=100000
Special Requests: NORMAL

## 2013-12-14 NOTE — Progress Notes (Signed)
Quick Note:  Results are abnormal as noted, but have been adequately treated. No further action necessary. ______ 

## 2013-12-16 ENCOUNTER — Telehealth (HOSPITAL_COMMUNITY): Payer: Self-pay | Admitting: *Deleted

## 2013-12-16 NOTE — ED Notes (Addendum)
Dr. Lorenz Coaster e-prescribed Keflex for UTI  to pt.'s pharmacy. I called pt. and left a message to call. Call 1. Vassie Moselle 12/16/2013 GC/Chlamydia and Affirm neg.  I called pt. Call 2.  Pt. verified x 2 and given results.  Pt. Told she needs Keflex for UTI and where to pick up her Rx.  If not better after medication to get rechecked and drink plenty of fluids. 12/17/2013

## 2014-01-21 ENCOUNTER — Emergency Department (INDEPENDENT_AMBULATORY_CARE_PROVIDER_SITE_OTHER)
Admission: EM | Admit: 2014-01-21 | Discharge: 2014-01-21 | Disposition: A | Discharge status: Home / Self Care | Payer: Self-pay | Source: Home / Self Care | Attending: Family Medicine | Admitting: Family Medicine

## 2014-01-21 ENCOUNTER — Encounter (HOSPITAL_COMMUNITY): Payer: Self-pay | Admitting: Emergency Medicine

## 2014-01-21 ENCOUNTER — Other Ambulatory Visit (HOSPITAL_COMMUNITY)
Admission: RE | Admit: 2014-01-21 | Discharge: 2014-01-21 | Disposition: A | Discharge status: Home / Self Care | Payer: Self-pay | Source: Ambulatory Visit | Attending: Family Medicine | Admitting: Family Medicine

## 2014-01-21 DIAGNOSIS — N760 Acute vaginitis: Secondary | ICD-10-CM | POA: Insufficient documentation

## 2014-01-21 DIAGNOSIS — Z113 Encounter for screening for infections with a predominantly sexual mode of transmission: Secondary | ICD-10-CM | POA: Insufficient documentation

## 2014-01-21 DIAGNOSIS — A499 Bacterial infection, unspecified: Secondary | ICD-10-CM

## 2014-01-21 DIAGNOSIS — B9689 Other specified bacterial agents as the cause of diseases classified elsewhere: Secondary | ICD-10-CM

## 2014-01-21 LAB — POCT URINALYSIS DIP (DEVICE)
BILIRUBIN URINE: NEGATIVE
Glucose, UA: NEGATIVE mg/dL
Hgb urine dipstick: NEGATIVE
KETONES UR: NEGATIVE mg/dL
LEUKOCYTES UA: NEGATIVE
Nitrite: NEGATIVE
Protein, ur: NEGATIVE mg/dL
Specific Gravity, Urine: 1.025 (ref 1.005–1.030)
Urobilinogen, UA: 0.2 mg/dL (ref 0.0–1.0)
pH: 7 (ref 5.0–8.0)

## 2014-01-21 LAB — CERVICOVAGINAL ANCILLARY ONLY
WET PREP (BD AFFIRM): NEGATIVE
Wet Prep (BD Affirm): NEGATIVE
Wet Prep (BD Affirm): NEGATIVE

## 2014-01-21 LAB — POCT PREGNANCY, URINE: Preg Test, Ur: NEGATIVE

## 2014-01-21 MED ORDER — METRONIDAZOLE 500 MG PO TABS: 500.0000 mg | ORAL_TABLET | Freq: Two times a day (BID) | ORAL | Status: DC

## 2014-01-21 MED ORDER — FLUCONAZOLE 150 MG PO TABS
150.0000 mg | ORAL_TABLET | Freq: Once | ORAL | Status: DC
Start: 2014-01-21 — End: 2015-04-04

## 2014-01-21 NOTE — ED Notes (Signed)
Pt here today because she has had vaginal itching for 1 week, pt also said that she has had some urinary urgency as well, pt says that she feels like she has to urinate urgently, then she only urinates a small amount

## 2014-01-21 NOTE — Discharge Instructions (Signed)
Thank you for coming in today. If your belly pain worsens, or you have high fever, bad vomiting, blood in your stool or black tarry stool go to the Emergency Room.  Take flagyl twice daily for 1 week for BV. Do not take with alcohol.  Use fluconazole once for yeast infection.  Come back as needed.  Please quit smoking.     Bacterial Vaginosis Bacterial vaginosis is a vaginal infection that occurs when the normal balance of bacteria in the vagina is disrupted. It results from an overgrowth of certain bacteria. This is the most common vaginal infection in women of childbearing age. Treatment is important to prevent complications, especially in pregnant women, as it can cause a premature delivery. CAUSES  Bacterial vaginosis is caused by an increase in harmful bacteria that are normally present in smaller amounts in the vagina. Several different kinds of bacteria can cause bacterial vaginosis. However, the reason that the condition develops is not fully understood. RISK FACTORS Certain activities or behaviors can put you at an increased risk of developing bacterial vaginosis, including:  Having a new sex partner or multiple sex partners.  Douching.  Using an intrauterine device (IUD) for contraception. Women do not get bacterial vaginosis from toilet seats, bedding, swimming pools, or contact with objects around them. SIGNS AND SYMPTOMS  Some women with bacterial vaginosis have no signs or symptoms. Common symptoms include:  Grey vaginal discharge.  A fishlike odor with discharge, especially after sexual intercourse.  Itching or burning of the vagina and vulva.  Burning or pain with urination. DIAGNOSIS  Your health care provider will take a medical history and examine the vagina for signs of bacterial vaginosis. A sample of vaginal fluid may be taken. Your health care provider will look at this sample under a microscope to check for bacteria and abnormal cells. A vaginal pH test may  also be done.  TREATMENT  Bacterial vaginosis may be treated with antibiotic medicines. These may be given in the form of a pill or a vaginal cream. A second round of antibiotics may be prescribed if the condition comes back after treatment.  HOME CARE INSTRUCTIONS   Only take over-the-counter or prescription medicines as directed by your health care provider.  If antibiotic medicine was prescribed, take it as directed. Make sure you finish it even if you start to feel better.  Do not have sex until treatment is completed.  Tell all sexual partners that you have a vaginal infection. They should see their health care provider and be treated if they have problems, such as a mild rash or itching.  Practice safe sex by using condoms and only having one sex partner. SEEK MEDICAL CARE IF:   Your symptoms are not improving after 3 days of treatment.  You have increased discharge or pain.  You have a fever. MAKE SURE YOU:   Understand these instructions.  Will watch your condition.  Will get help right away if you are not doing well or get worse. FOR MORE INFORMATION  Centers for Disease Control and Prevention, Division of STD Prevention: SolutionApps.co.zawww.cdc.gov/std American Sexual Health Association (ASHA): www.ashastd.org  Document Released: 03/22/2005 Document Revised: 01/10/2013 Document Reviewed: 11/01/2012 [facility] Patient Information 2015 [place], [place]. This information is not intended to replace advice given to you by your health care provider. Make sure you discuss any questions you have with your health care provider.

## 2014-01-21 NOTE — ED Provider Notes (Signed)
Tammy Gardner is a 23 y.o. female who presents to Urgent Care today for vaginal itching and discharge present for one week. Patient has mild urinary frequency and urgency. Her symptoms are consistent with previous episodes of bacterial vaginosis. She denies any fevers or chills nausea vomiting or diarrhea. She has not tried any medications yet.   Past Medical History  Diagnosis Date  . Bacterial vaginosis   . Chlamydia   . Trichomonas    History  Substance Use Topics  . Smoking status: Current Some Day Smoker    Types: Cigarettes  . Smokeless tobacco: Never Used  . Alcohol Use: Yes     Comment: occ   ROS as above Medications: No current facility-administered medications for this encounter.   Current Outpatient Prescriptions  Medication Sig Dispense Refill  . fluconazole (DIFLUCAN) 150 MG tablet Take 1 tablet (150 mg total) by mouth once.  1 tablet  2  . metroNIDAZOLE (FLAGYL) 500 MG tablet Take 1 tablet (500 mg total) by mouth 2 (two) times daily.  14 tablet  2    Exam:  BP 116/74  Pulse 71  Temp(Src) 97 F (36.1 C) (Oral)  SpO2 98%  LMP 12/12/2013 Gen: Well NAD GYN: Normal external genitalia. Vaginal canal with clumpy white discharge. Normal-appearing cervix. Nontender.  Results for orders placed during the hospital encounter of 01/21/14 (from the past 24 hour(s))  POCT URINALYSIS DIP (DEVICE)     Status: None   Collection Time    01/21/14  8:31 AM      Result Value Ref Range   Glucose, UA NEGATIVE  NEGATIVE mg/dL   Bilirubin Urine NEGATIVE  NEGATIVE   Ketones, ur NEGATIVE  NEGATIVE mg/dL   Specific Gravity, Urine 1.025  1.005 - 1.030   Hgb urine dipstick NEGATIVE  NEGATIVE   pH 7.0  5.0 - 8.0   Protein, ur NEGATIVE  NEGATIVE mg/dL   Urobilinogen, UA 0.2  0.0 - 1.0 mg/dL   Nitrite NEGATIVE  NEGATIVE   Leukocytes, UA NEGATIVE  NEGATIVE   No results found.  Assessment and Plan: 23 y.o. female with pectoral vaginosis versus vaginal yeast infection. Cytology  pending. Urine culture pending. Treatment with metronidazole and fluconazole.  Discussed warning signs or symptoms. Please see discharge instructions. Patient expresses understanding.     Rodolph BongEvan S Havanna Groner, MD 01/21/14 [phone]

## 2014-01-22 LAB — URINE CULTURE
CULTURE: NO GROWTH
Colony Count: NO GROWTH
Special Requests: NORMAL

## 2014-01-22 LAB — CERVICOVAGINAL ANCILLARY ONLY
CHLAMYDIA, DNA PROBE: NEGATIVE
Neisseria Gonorrhea: NEGATIVE

## 2014-02-04 ENCOUNTER — Encounter (HOSPITAL_COMMUNITY): Payer: Self-pay | Admitting: Emergency Medicine

## 2014-09-08 ENCOUNTER — Emergency Department (HOSPITAL_COMMUNITY)
Admission: EM | Admit: 2014-09-08 | Discharge: 2014-09-08 | Discharge status: Absent without leave | Payer: Self-pay | Source: Home / Self Care

## 2014-09-09 ENCOUNTER — Encounter (HOSPITAL_COMMUNITY): Payer: Self-pay | Admitting: Emergency Medicine

## 2014-09-09 ENCOUNTER — Emergency Department (INDEPENDENT_AMBULATORY_CARE_PROVIDER_SITE_OTHER)
Admission: EM | Admit: 2014-09-09 | Discharge: 2014-09-09 | Disposition: A | Discharge status: Home / Self Care | Payer: Self-pay | Source: Home / Self Care | Attending: Family Medicine | Admitting: Family Medicine

## 2014-09-09 ENCOUNTER — Other Ambulatory Visit (HOSPITAL_COMMUNITY)
Admission: RE | Admit: 2014-09-09 | Discharge: 2014-09-09 | Disposition: A | Discharge status: Home / Self Care | Payer: Self-pay | Source: Ambulatory Visit | Attending: Family Medicine | Admitting: Family Medicine

## 2014-09-09 DIAGNOSIS — Z113 Encounter for screening for infections with a predominantly sexual mode of transmission: Secondary | ICD-10-CM | POA: Insufficient documentation

## 2014-09-09 DIAGNOSIS — Z711 Person with feared health complaint in whom no diagnosis is made: Secondary | ICD-10-CM

## 2014-09-09 DIAGNOSIS — N760 Acute vaginitis: Secondary | ICD-10-CM | POA: Insufficient documentation

## 2014-09-09 LAB — POCT URINALYSIS DIP (DEVICE)
Bilirubin Urine: NEGATIVE
Glucose, UA: NEGATIVE mg/dL
KETONES UR: NEGATIVE mg/dL
Leukocytes, UA: NEGATIVE
NITRITE: NEGATIVE
Protein, ur: 30 mg/dL — AB
UROBILINOGEN UA: 2 mg/dL — AB (ref 0.0–1.0)
pH: 6.5 (ref 5.0–8.0)

## 2014-09-09 LAB — POCT PREGNANCY, URINE: PREG TEST UR: NEGATIVE
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
## 2014-09-09 NOTE — Discharge Instructions (Signed)
We will call for any abnormal test findings within 3 days.
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
## 2014-09-10 LAB — CERVICOVAGINAL ANCILLARY ONLY
Chlamydia: NEGATIVE
NEISSERIA GONORRHEA: NEGATIVE
Wet Prep (BD Affirm): POSITIVE — AB

## 2014-09-10 NOTE — ED Notes (Signed)
Final STD check report negative for GC, chlamydia, positive only for gardnerella. No further action required

## 2015-03-18 ENCOUNTER — Emergency Department (INDEPENDENT_AMBULATORY_CARE_PROVIDER_SITE_OTHER)
Admission: EM | Admit: 2015-03-18 | Discharge: 2015-03-18 | Disposition: A | Discharge status: Home / Self Care | Payer: Self-pay | Source: Home / Self Care | Attending: Emergency Medicine | Admitting: Emergency Medicine

## 2015-03-18 ENCOUNTER — Encounter (HOSPITAL_COMMUNITY): Payer: Self-pay | Admitting: Emergency Medicine

## 2015-03-18 DIAGNOSIS — S8002XA Contusion of left knee, initial encounter: Secondary | ICD-10-CM

## 2015-03-18 DIAGNOSIS — S0083XA Contusion of other part of head, initial encounter: Secondary | ICD-10-CM
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
## 2015-04-04 ENCOUNTER — Inpatient Hospital Stay (HOSPITAL_COMMUNITY)
Admission: AD | Admit: 2015-04-04 | Discharge: 2015-04-04 | Disposition: A | Discharge status: Home / Self Care | Payer: Medicaid Other | Source: Ambulatory Visit | Attending: Obstetrics & Gynecology | Admitting: Obstetrics & Gynecology

## 2015-04-04 ENCOUNTER — Encounter (HOSPITAL_COMMUNITY): Payer: Self-pay

## 2015-04-04 DIAGNOSIS — Z3201 Encounter for pregnancy test, result positive: Secondary | ICD-10-CM | POA: Insufficient documentation

## 2015-04-04 DIAGNOSIS — N926 Irregular menstruation, unspecified: Secondary | ICD-10-CM

## 2015-04-04 LAB — POCT PREGNANCY, URINE: Preg Test, Ur: POSITIVE — AB

## 2015-04-04 NOTE — MAU Provider Note (Signed)
S:  Tammy Gardner is a 24 y.o. G2P0010 at Unknown wks here for confirmation of pregnancy.  Patient's Patient's last menstrual period was 02/22/2015.Marland Kitchen.  Denies any vaginal bleeding or abdominal pain.  Plans to get prenatal care at undecided.  O:  Past Medical History  Diagnosis Date  . Bacterial vaginosis   . Chlamydia   . Trichomonas     No family history on file.   Filed Vitals:   04/04/15 1443  BP: 128/77  Pulse: 78  Temp: 97.9 F (36.6 C)  Resp: 18   General:  A&OX3 with no signs of acute distress. She appears well-developed and well-nourished. No distress.  Neck: Normal range of motion.  Pulmonary/Chest: Effort normal. No respiratory distress.  Musculoskeletal: Normal range of motion.  Neurological: She is alert and oriented to person, place, and time.  Skin: Skin is warm and dry.   A: Positive Pregnancy Test  P: Explained benefits of taking prenatal vitamins w/folic acid early in pregnancy. Begin prenatal care. Reviewed warning signs of pregnancy.  Judeth HornErin Renell Coaxum, NP

## 2015-04-04 NOTE — MAU Note (Signed)
Patient here for pregnancy confirmation LMP 02/22/15 had positive UPT denies problems

## 2015-04-04 NOTE — Discharge Instructions (Signed)
First Trimester of Pregnancy The first trimester of pregnancy is from week 1 until the end of week 12 (months 1 through 3). A week after a sperm fertilizes an egg, the egg will implant on the wall of the uterus. This embryo will begin to develop into a baby. Genes from you and your partner are forming the baby. The female genes determine whether the baby is a boy or a girl. At 6-8 weeks, the eyes and face are formed, and the heartbeat can be seen on ultrasound. At the end of 12 weeks, all the baby's organs are formed.  Now that you are pregnant, you will want to do everything you can to have a healthy baby. Two of the most important things are to get good prenatal care and to follow your health care provider's instructions. Prenatal care is all the medical care you receive before the baby's birth. This care will help prevent, find, and treat any problems during the pregnancy and childbirth. BODY CHANGES Your body goes through many changes during pregnancy. The changes vary from woman to woman.   You may gain or lose a couple of pounds at first.  You may feel sick to your stomach (nauseous) and throw up (vomit). If the vomiting is uncontrollable, call your health care provider.  You may tire easily.  You may develop headaches that can be relieved by medicines approved by your health care provider.  You may urinate more often. Painful urination may mean you have a bladder infection.  You may develop heartburn as a result of your pregnancy.  You may develop constipation because certain hormones are causing the muscles that push waste through your intestines to slow down.  You may develop hemorrhoids or swollen, bulging veins (varicose veins).  Your breasts may begin to grow larger and become tender. Your nipples may stick out more, and the tissue that surrounds them (areola) may become darker.  Your gums may bleed and may be sensitive to brushing and flossing.  Dark spots or blotches  (chloasma, mask of pregnancy) may develop on your face. This will likely fade after the baby is born.  Your menstrual periods will stop.  You may have a loss of appetite.  You may develop cravings for certain kinds of food.  You may have changes in your emotions from day to day, such as being excited to be pregnant or being concerned that something may go wrong with the pregnancy and baby.  You may have more vivid and strange dreams.  You may have changes in your hair. These can include thickening of your hair, rapid growth, and changes in texture. Some women also have hair loss during or after pregnancy, or hair that feels dry or thin. Your hair will most likely return to normal after your baby is born. WHAT TO EXPECT AT YOUR PRENATAL VISITS During a routine prenatal visit:  You will be weighed to make sure you and the baby are growing normally.  Your blood pressure will be taken.  Your abdomen will be measured to track your baby's growth.  The fetal heartbeat will be listened to starting around week 10 or 12 of your pregnancy.  Test results from any previous visits will be discussed. Your health care provider may ask you:  How you are feeling.  If you are feeling the baby move.  If you have had any abnormal symptoms, such as leaking fluid, bleeding, severe headaches, or abdominal cramping.  If you are using any tobacco products,   including cigarettes, chewing tobacco, and electronic cigarettes.  If you have any questions. Other tests that may be performed during your first trimester include:  Blood tests to find your blood type and to check for the presence of any previous infections. They will also be used to check for low iron levels (anemia) and Rh antibodies. Later in the pregnancy, blood tests for diabetes will be done along with other tests if problems develop.  Urine tests to check for infections, diabetes, or protein in the urine.  An ultrasound to confirm the  proper growth and development of the baby.  An amniocentesis to check for possible genetic problems.  Fetal screens for spina bifida and Down syndrome.  You may need other tests to make sure you and the baby are doing well.  HIV (human immunodeficiency virus) testing. Routine prenatal testing includes screening for HIV, unless you choose not to have this test. HOME CARE INSTRUCTIONS  Medicines  Follow your health care provider's instructions regarding medicine use. Specific medicines may be either safe or unsafe to take during pregnancy.  Take your prenatal vitamins as directed.  If you develop constipation, try taking a stool softener if your health care provider approves. Diet  Eat regular, well-balanced meals. Choose a variety of foods, such as meat or vegetable-based protein, fish, milk and low-fat dairy products, vegetables, fruits, and whole grain breads and cereals. Your health care provider will help you determine the amount of weight gain that is right for you.  Avoid raw meat and uncooked cheese. These carry germs that can cause birth defects in the baby.  Eating four or five small meals rather than three large meals a day may help relieve nausea and vomiting. If you start to feel nauseous, eating a few soda crackers can be helpful. Drinking liquids between meals instead of during meals also seems to help nausea and vomiting.  If you develop constipation, eat more high-fiber foods, such as fresh vegetables or fruit and whole grains. Drink enough fluids to keep your urine clear or pale yellow. Activity and Exercise  Exercise only as directed by your health care provider. Exercising will help you:  Control your weight.  Stay in shape.  Be prepared for labor and delivery.  Experiencing pain or cramping in the lower abdomen or low back is a good sign that you should stop exercising. Check with your health care provider before continuing normal exercises.  Try to avoid  standing for long periods of time. Move your legs often if you must stand in one place for a long time.  Avoid heavy lifting.  Wear low-heeled shoes, and practice good posture.  You may continue to have sex unless your health care provider directs you otherwise. Relief of Pain or Discomfort  Wear a good support bra for breast tenderness.   Take warm sitz baths to soothe any pain or discomfort caused by hemorrhoids. Use hemorrhoid cream if your health care provider approves.   Rest with your legs elevated if you have leg cramps or low back pain.  If you develop varicose veins in your legs, wear support hose. Elevate your feet for 15 minutes, 3-4 times a day. Limit salt in your diet. Prenatal Care  Schedule your prenatal visits by the twelfth week of pregnancy. They are usually scheduled monthly at first, then more often in the last 2 months before delivery.  Write down your questions. Take them to your prenatal visits.  Keep all your prenatal visits as directed by your   health care provider. Safety  Wear your seat belt at all times when driving.  Make a list of emergency phone numbers, including numbers for family, friends, the hospital, and police and fire departments. General Tips  Ask your health care provider for a referral to a local prenatal education class. Begin classes no later than at the beginning of month 6 of your pregnancy.  Ask for help if you have counseling or nutritional needs during pregnancy. Your health care provider can offer advice or refer you to specialists for help with various needs.  Do not use hot tubs, steam rooms, or saunas.  Do not douche or use tampons or scented sanitary pads.  Do not cross your legs for long periods of time.  Avoid cat litter boxes and soil used by cats. These carry germs that can cause birth defects in the baby and possibly loss of the fetus by miscarriage or stillbirth.  Avoid all smoking, herbs, alcohol, and medicines  not prescribed by your health care provider. Chemicals in these affect the formation and growth of the baby.  Do not use any tobacco products, including cigarettes, chewing tobacco, and electronic cigarettes. If you need help quitting, ask your health care provider. You may receive counseling support and other resources to help you quit.  Schedule a dentist appointment. At home, brush your teeth with a soft toothbrush and be gentle when you floss. SEEK MEDICAL CARE IF:   You have dizziness.  You have mild pelvic cramps, pelvic pressure, or nagging pain in the abdominal area.  You have persistent nausea, vomiting, or diarrhea.  You have a bad smelling vaginal discharge.  You have pain with urination.  You notice increased swelling in your face, hands, legs, or ankles. SEEK IMMEDIATE MEDICAL CARE IF:   You have a fever.  You are leaking fluid from your vagina.  You have spotting or bleeding from your vagina.  You have severe abdominal cramping or pain.  You have rapid weight gain or loss.  You vomit blood or material that looks like coffee grounds.  You are exposed to German measles and have never had them.  You are exposed to fifth disease or chickenpox.  You develop a severe headache.  You have shortness of breath.  You have any kind of trauma, such as from a fall or a car accident.   This information is not intended to replace advice given to you by your health care provider. Make sure you discuss any questions you have with your health care provider.   Document Released: 03/16/2001 Document Revised: 04/12/2014 Document Reviewed: 01/30/2013 Elsevier Interactive Patient Education 2016 Elsevier Inc.  Safe Medications in Pregnancy   Acne: Benzoyl Peroxide Salicylic Acid  Backache/Headache: Tylenol: 2 regular strength every 4 hours OR              2 Extra strength every 6 hours  Colds/Coughs/Allergies: Benadryl (alcohol free) 25 mg every 6 hours as  needed Breath right strips Claritin Cepacol throat lozenges Chloraseptic throat spray Cold-Eeze- up to three times per day Cough drops, alcohol free Flonase (by prescription only) Guaifenesin Mucinex Robitussin DM (plain only, alcohol free) Saline nasal spray/drops Sudafed (pseudoephedrine) & Actifed ** use only after [redacted] weeks gestation and if you do not have high blood pressure Tylenol Vicks Vaporub Zinc lozenges Zyrtec   Constipation: Colace Ducolax suppositories Fleet enema Glycerin suppositories Metamucil Milk of magnesia Miralax Senokot Smooth move tea  Diarrhea: Kaopectate Imodium A-D  *NO pepto Bismol  Hemorrhoids: Anusol Anusol   HC Preparation H Tucks  Indigestion: Tums Maalox Mylanta Zantac  Pepcid  Insomnia: Benadryl (alcohol free) 25mg every 6 hours as needed Tylenol PM Unisom, no Gelcaps  Leg Cramps: Tums MagGel  Nausea/Vomiting:  Bonine Dramamine Emetrol Ginger extract Sea bands Meclizine  Nausea medication to take during pregnancy:  Unisom (doxylamine succinate 25 mg tablets) Take one tablet daily at bedtime. If symptoms are not adequately controlled, the dose can be increased to a maximum recommended dose of two tablets daily (1/2 tablet in the morning, 1/2 tablet mid-afternoon and one at bedtime). Vitamin B6 100mg tablets. Take one tablet twice a day (up to 200 mg per day).  Skin Rashes: Aveeno products Benadryl cream or 25mg every 6 hours as needed Calamine Lotion 1% cortisone cream  Yeast infection: Gyne-lotrimin 7 Monistat 7   **If taking multiple medications, please check labels to avoid duplicating the same active ingredients **take medication as directed on the label ** Do not exceed 4000 mg of tylenol in 24 hours **Do not take medications that contain aspirin or ibuprofen    

## 2015-04-06 NOTE — L&D Delivery Note (Signed)
Patient is 25 y.o. G2P0010 [redacted]w[redacted]d admitted for SOL. GBS+ with adequate prophylaxis with PCN.    Delivery Note At 6:02 PM a viable female was delivered via Vaginal, Spontaneous Delivery (Presentation: Left Occiput Anterior ).  APGAR: 9, 9; weight  pending.   Placenta status: spontaneous, intact  Cord: 3 vessel.   Anesthesia:  Epidural  Episiotomy: none   Lacerations:  2nd degree posterior sulcus (repaired), peri-urethral and left labial tear with good hemostasis not requiring repair  Suture Repair: 3.0 vicryl Est. Blood Loss (mL):  100 cc   Mom to postpartum.  Baby to Couplet care / Skin to Skin.  De HollingsheadCatherine L Wallace 11/29/2015, 6:34 PM  OB FELLOW DELIVERY ATTESTATION  I was gloved and present for the delivery in its entirety, and I agree with the above resident's note.    Ernestina PennaNicholas Naketa Daddario, MD 7:13 PM

## 2015-04-14 ENCOUNTER — Inpatient Hospital Stay (HOSPITAL_COMMUNITY)
Admission: AD | Admit: 2015-04-14 | Discharge: 2015-04-14 | Disposition: A | Discharge status: Home / Self Care | Payer: Medicaid Other | Source: Ambulatory Visit | Attending: Family Medicine | Admitting: Family Medicine

## 2015-04-14 ENCOUNTER — Encounter (HOSPITAL_COMMUNITY): Payer: Self-pay | Admitting: *Deleted

## 2015-04-14 DIAGNOSIS — R1030 Lower abdominal pain, unspecified: Secondary | ICD-10-CM

## 2015-04-14 DIAGNOSIS — R110 Nausea: Secondary | ICD-10-CM | POA: Insufficient documentation

## 2015-04-14 DIAGNOSIS — R109 Unspecified abdominal pain: Secondary | ICD-10-CM | POA: Insufficient documentation

## 2015-04-14 DIAGNOSIS — Z87891 Personal history of nicotine dependence: Secondary | ICD-10-CM | POA: Diagnosis not present

## 2015-04-14 DIAGNOSIS — O26891 Other specified pregnancy related conditions, first trimester: Secondary | ICD-10-CM | POA: Insufficient documentation

## 2015-04-14 DIAGNOSIS — N899 Noninflammatory disorder of vagina, unspecified: Secondary | ICD-10-CM | POA: Diagnosis present

## 2015-04-14 DIAGNOSIS — Z3A08 8 weeks gestation of pregnancy: Secondary | ICD-10-CM | POA: Diagnosis not present

## 2015-04-14 DIAGNOSIS — O219 Vomiting of pregnancy, unspecified: Secondary | ICD-10-CM

## 2015-04-14 LAB — WET PREP, GENITAL
Clue Cells Wet Prep HPF POC: NONE SEEN
Sperm: NONE SEEN
TRICH WET PREP: NONE SEEN
YEAST WET PREP: NONE SEEN

## 2015-04-14 LAB — URINALYSIS, ROUTINE W REFLEX MICROSCOPIC
Bilirubin Urine: NEGATIVE
Glucose, UA: NEGATIVE mg/dL
Hgb urine dipstick: NEGATIVE
Ketones, ur: 15 mg/dL — AB
LEUKOCYTES UA: NEGATIVE
Nitrite: NEGATIVE
PROTEIN: NEGATIVE mg/dL
SPECIFIC GRAVITY, URINE: 1.015 (ref 1.005–1.030)
pH: 6 (ref 5.0–8.0)

## 2015-04-14 MED ORDER — PROMETHAZINE HCL 12.5 MG PO TABS: 12.5000 mg | ORAL_TABLET | Freq: Four times a day (QID) | ORAL | Status: DC | PRN

## 2015-04-14 MED ORDER — PROMETHAZINE HCL 25 MG PO TABS: 12.5000 mg | ORAL_TABLET | ORAL | Status: DC | PRN

## 2015-04-14 MED ORDER — ONDANSETRON 4 MG PO TBDP
4.0000 mg | ORAL_TABLET | Freq: Once | ORAL | Status: AC
  Administered 2015-04-14: 4 mg via ORAL
  Filled 2015-04-14: qty 1

## 2015-04-14 NOTE — MAU Provider Note (Signed)
History   G2P0010 in with c/o abd pain, nausea, and vag discharge. States can keep fluids down but gets sick for the past two days when she eats.  CSN: [id number]  Arrival date & time 04/14/15  1710   None     No chief complaint on file.   Vaginal Discharge The patient's primary symptoms include vaginal discharge. This is a new problem. The current episode started in the past 7 days. The problem occurs constantly. The problem has been unchanged. The pain is mild. The problem affects both sides. She is pregnant. Associated symptoms include abdominal pain. There has been no bleeding. Nothing aggravates the symptoms. She has tried nothing for the symptoms. She is sexually active. It is unknown whether or not her partner has an STD. She uses nothing for contraception. Her past medical history is significant for an STD.    Past Medical History  Diagnosis Date  . Bacterial vaginosis   . Chlamydia   . Trichomonas     Past Surgical History  Procedure Laterality Date  . Termination    . Induced abortion      History reviewed. No pertinent family history.  Social History  Substance Use Topics  . Smoking status: Former Smoker    Types: Cigarettes  . Smokeless tobacco: Never Used  . Alcohol Use: No     Comment: no use since beginning of pregnancy    OB History    Gravida Para Term Preterm AB TAB SAB Ectopic Multiple Living   2 0 0 0 1 1 0 0 0 0       Review of Systems  Gastrointestinal: Positive for abdominal pain.  Genitourinary: Positive for vaginal discharge.  Musculoskeletal: Negative.   Skin: Negative.   Allergic/Immunologic: Negative.   Neurological: Negative.   Hematological: Negative.   Psychiatric/Behavioral: Negative.     Allergies  Review of patient's allergies indicates no known allergies.  Home Medications  No current outpatient prescriptions on file.  LMP 02/22/2015  Physical Exam  Constitutional: She is oriented to person, place, and time. She appears  well-developed and well-nourished.  HENT:  Head: Normocephalic.  Eyes: Pupils are equal, round, and reactive to light.  Neck: Normal range of motion.  Cardiovascular: Normal rate, regular rhythm, normal heart sounds and intact distal pulses.   Pulmonary/Chest: Effort normal and breath sounds normal.  Abdominal: Soft. Bowel sounds are normal.  Genitourinary: Vagina normal and uterus normal.  Musculoskeletal: Normal range of motion.  Neurological: She is alert and oriented to person, place, and time. She has normal reflexes.  Skin: Skin is warm and dry.  Psychiatric: She has a normal mood and affect. Her behavior is normal. Judgment and thought content normal.    MAU Course  Procedures (including critical care time)  Labs Reviewed  WET PREP, GENITAL  URINALYSIS, ROUTINE W REFLEX MICROSCOPIC (NOT AT [facility])  RPR  GC/CHLAMYDIA PROBE AMP ([place]   No results found.   No diagnosis found.    MDM  Nausea of preg abd pain [place] show early IUP Wet prep neg chla and GC done Plan d/c home with Rx for phenergan

## 2015-04-14 NOTE — Discharge Instructions (Signed)
Hyperemesis Gravidarum  Hyperemesis gravidarum is a severe form of nausea and vomiting that happens during pregnancy. Hyperemesis is worse than morning sickness. It may cause you to have nausea or vomiting all day for many days. It may keep you from eating and drinking enough food and liquids. Hyperemesis usually occurs during the first half (the first 20 weeks) of pregnancy. It often goes away once a woman is in her second half of pregnancy. However, sometimes hyperemesis continues through an entire pregnancy.   CAUSES   The cause of this condition is not completely known but is thought to be related to changes in the body's hormones when pregnant. It could be from the high level of the pregnancy hormone or an increase in estrogen in the body.   SIGNS AND SYMPTOMS    Severe nausea and vomiting.   Nausea that does not go away.   Vomiting that does not allow you to keep any food down.   Weight loss and body fluid loss (dehydration).   Having no desire to eat or not liking food you have previously enjoyed.  DIAGNOSIS   Your health care provider will do a physical exam and ask you about your symptoms. He or she may also order blood tests and urine tests to make sure something else is not causing the problem.   TREATMENT   You may only need medicine to control the problem. If medicines do not control the nausea and vomiting, you will be treated in the hospital to prevent dehydration, increased acid in the blood (acidosis), weight loss, and changes in the electrolytes in your body that may harm the unborn baby (fetus). You may need IV fluids.   HOME CARE INSTRUCTIONS    Only take over-the-counter or prescription medicines as directed by your health care provider.   Try eating a couple of dry crackers or toast in the morning before getting out of bed.   Avoid foods and smells that upset your stomach.   Avoid fatty and spicy foods.   Eat 5-6 small meals a day.   Do not drink when eating meals. Drink between  meals.   For snacks, eat high-protein foods, such as cheese.   Eat or suck on things that have ginger in them. Ginger helps nausea.   Avoid food preparation. The smell of food can spoil your appetite.   Avoid iron pills and iron in your multivitamins until after 3-4 months of being pregnant. However, consult with your health care provider before stopping any prescribed iron pills.  SEEK MEDICAL CARE IF:    Your abdominal pain increases.   You have a severe headache.   You have vision problems.   You are losing weight.  SEEK IMMEDIATE MEDICAL CARE IF:    You are unable to keep fluids down.   You vomit blood.   You have constant nausea and vomiting.   You have excessive weakness.   You have extreme thirst.   You have dizziness or fainting.   You have a fever or persistent symptoms for more than 2-3 days.   You have a fever and your symptoms suddenly get worse.  MAKE SURE YOU:    Understand these instructions.   Will watch your condition.   Will get help right away if you are not doing well or get worse.     This information is not intended to replace advice given to you by your health care provider. Make sure you discuss any questions you have with   your health care provider.     Document Released: 03/22/2005 Document Revised: 01/10/2013 Document Reviewed: 11/01/2012  Elsevier Interactive Patient Education 2016 [employer]

## 2015-04-14 NOTE — MAU Note (Signed)
Pt complains of nausea and vomiting for the last two days and occasional sharp stomach pain.  Pt also complains of white, odorless vaginal discharge with itching.

## 2015-04-15 LAB — GC/CHLAMYDIA PROBE AMP (~~LOC~~) NOT AT ARMC
CHLAMYDIA, DNA PROBE: NEGATIVE
Neisseria Gonorrhea: NEGATIVE

## 2015-04-15 LAB — RPR: RPR Ser Ql: NONREACTIVE

## 2015-05-06 ENCOUNTER — Encounter (HOSPITAL_COMMUNITY): Payer: Self-pay | Admitting: *Deleted

## 2015-05-06 ENCOUNTER — Inpatient Hospital Stay (HOSPITAL_COMMUNITY)
Admission: AD | Admit: 2015-05-06 | Discharge: 2015-05-06 | Disposition: A | Discharge status: Home / Self Care | Payer: Medicaid Other | Source: Ambulatory Visit | Attending: Obstetrics & Gynecology | Admitting: Obstetrics & Gynecology

## 2015-05-06 DIAGNOSIS — O210 Mild hyperemesis gravidarum: Secondary | ICD-10-CM | POA: Diagnosis not present

## 2015-05-06 DIAGNOSIS — R1031 Right lower quadrant pain: Secondary | ICD-10-CM | POA: Insufficient documentation

## 2015-05-06 DIAGNOSIS — O26891 Other specified pregnancy related conditions, first trimester: Secondary | ICD-10-CM

## 2015-05-06 DIAGNOSIS — Z87891 Personal history of nicotine dependence: Secondary | ICD-10-CM | POA: Insufficient documentation

## 2015-05-06 DIAGNOSIS — N898 Other specified noninflammatory disorders of vagina: Secondary | ICD-10-CM | POA: Diagnosis not present

## 2015-05-06 DIAGNOSIS — Z3A10 10 weeks gestation of pregnancy: Secondary | ICD-10-CM | POA: Insufficient documentation

## 2015-05-06 LAB — WET PREP, GENITAL
Clue Cells Wet Prep HPF POC: NONE SEEN
SPERM: NONE SEEN
Trich, Wet Prep: NONE SEEN
YEAST WET PREP: NONE SEEN

## 2015-05-06 LAB — URINALYSIS, ROUTINE W REFLEX MICROSCOPIC
Bilirubin Urine: NEGATIVE
GLUCOSE, UA: NEGATIVE mg/dL
HGB URINE DIPSTICK: NEGATIVE
Ketones, ur: 40 mg/dL — AB
Leukocytes, UA: NEGATIVE
Nitrite: NEGATIVE
PH: 6 (ref 5.0–8.0)
Protein, ur: NEGATIVE mg/dL
SPECIFIC GRAVITY, URINE: 1.02 (ref 1.005–1.030)

## 2015-05-06 MED ORDER — ONDANSETRON HCL 4 MG PO TABS: 4.0000 mg | ORAL_TABLET | Freq: Four times a day (QID) | ORAL | Status: DC

## 2015-05-06 NOTE — MAU Note (Addendum)
Noted some d/c for past few days.  usually would go get some OTC treatment for yeast and treat it herself, but was told that wasn't safe to do when she is preg.   Is having pain in RLQ about the same time, 4days to a wk ago. Pee is a little cloudy, having urgency - no pain

## 2015-05-06 NOTE — Discharge Instructions (Signed)

## 2015-05-06 NOTE — MAU Provider Note (Signed)
History     CSN: [id number]  Arrival date and time: 05/06/15 1821   First Provider Initiated Contact with Patient 05/06/15 1856      Chief Complaint  Patient presents with  . Vaginal Discharge   HPI Pt is G2P0010 @ [redacted]w[redacted]d pregnant c/o of vaginal discharge for 4d to 1 week. Pt also has intermittent abdominal when she has a full bladder.  Pt has frequency of urination but no dysuria.  Pt denies constipation, diarrhea, spotting or bleeding.  Pt has some nausea in the morning, which is improving. Pt has Rx for phenergan, but makes her sleepy and wants a prescription for zofran, which she had once here in [facility]U. Pt has thick white vaginal discharge that she thinks is a yeast infection.  Pt denies odor. Pt has not used any medication. Pt plans to get care at Dr. Verdell Carmine office..    RN note:  Expand All Collapse All   Noted some d/c for past few days. usually would go get some OTC treatment for yeast and treat it herself, but was told that wasn't safe to do when she is preg. Is having pain in RLQ about the same time, 4days to a wk ago. Pee is a little cloudy, having urgency - no pain       Past Medical History  Diagnosis Date  . Bacterial vaginosis   . Chlamydia   . Trichomonas     Past Surgical History  Procedure Laterality Date  . Termination    . Induced abortion      History reviewed. No pertinent family history.  Social History  Substance Use Topics  . Smoking status: Former Smoker    Types: Cigarettes  . Smokeless tobacco: Never Used  . Alcohol Use: No     Comment: no use since beginning of pregnancy    Allergies: No Known Allergies  Prescriptions prior to admission  Medication Sig Dispense Refill Last Dose  . Prenatal Vit-Fe Fumarate-FA (PRENATAL MULTIVITAMIN) TABS tablet Take 1 tablet by mouth at bedtime.   04/13/2015 at Unknown time  . promethazine (PHENERGAN) 12.5 MG tablet Take 1 tablet (12.5 mg total) by mouth every 6 (six) hours as needed for nausea or  vomiting. 30 tablet 3     Review of Systems  Constitutional: Negative for fever and chills.  Gastrointestinal: Positive for nausea and abdominal pain. Negative for vomiting and diarrhea.  Genitourinary: Positive for frequency. Negative for dysuria and urgency.   Physical Exam   Blood pressure 133/78, pulse 83, temperature 98.1 F (36.7 C), temperature source Oral, resp. rate 18, weight 118 lb (53.524 kg), last menstrual period 02/22/2015.  Physical Exam  Nursing note and vitals reviewed. Constitutional: She is oriented to person, place, and time. She appears well-developed and well-nourished. No distress.  HENT:  Head: Normocephalic.  Eyes: Pupils are equal, round, and reactive to light.  Neck: Normal range of motion. Neck supple.  Cardiovascular: Normal rate.   Respiratory: Effort normal.  GI: Soft. She exhibits no distension. There is no tenderness. There is no rebound.  FHT  Doppler 164 bpm  Genitourinary:  Small amount of thick white discharge in vault cervix closed, NT; uterus 10 weeks size, NT FHT audible with doppler 164 bpm  Musculoskeletal: Normal range of motion.  Neurological: She is alert and oriented to person, place, and time.  Skin: Skin is warm and dry.  Psychiatric: She has a normal mood and affect.    MAU Course  Procedures  Results for orders placed  or performed during the hospital encounter of 05/06/15 (from the past 24 hour(s))  Urinalysis, Routine w reflex microscopic (not at [facility])     Status: Abnormal   Collection Time: 05/06/15  6:45 PM  Result Value Ref Range   Color, Urine YELLOW YELLOW   APPearance CLEAR CLEAR   Specific Gravity, Urine 1.020 1.005 - 1.030   pH 6.0 5.0 - 8.0   Glucose, UA NEGATIVE NEGATIVE mg/dL   Hgb urine dipstick NEGATIVE NEGATIVE   Bilirubin Urine NEGATIVE NEGATIVE   Ketones, ur 40 (A) NEGATIVE mg/dL   Protein, ur NEGATIVE NEGATIVE mg/dL   Nitrite NEGATIVE NEGATIVE   Leukocytes, UA NEGATIVE NEGATIVE  Wet prep, genital      Status: Abnormal   Collection Time: 05/06/15  7:14 PM  Result Value Ref Range   Yeast Wet Prep HPF POC NONE SEEN NONE SEEN   Trich, Wet Prep NONE SEEN NONE SEEN   Clue Cells Wet Prep HPF POC NONE SEEN NONE SEEN   WBC, Wet Prep HPF POC FEW (A) NONE SEEN   Sperm NONE SEEN   GC/chlamydia pending  Assessment and Plan  Vaginal discharge in pregnancy- first trimester Viable IUP [redacted]w[redacted]d Nausea and vomiting in pregnancy- Rx for zofran  tablets; encourage increase in fluids and small frequent protein meals F/u with Dr. Clearance Coots for [facility] appointment  [facility] 05/06/2015, 6:57 PM

## 2015-05-07 LAB — GC/CHLAMYDIA PROBE AMP (~~LOC~~) NOT AT ARMC
CHLAMYDIA, DNA PROBE: NEGATIVE
NEISSERIA GONORRHEA: NEGATIVE

## 2015-05-15 ENCOUNTER — Encounter (HOSPITAL_COMMUNITY): Payer: Self-pay

## 2015-05-29 ENCOUNTER — Telehealth: Payer: Self-pay | Admitting: *Deleted

## 2015-05-29 NOTE — Telephone Encounter (Signed)
Patient has a NOB appointment next week- she is use antinausea medication that is making her sleepy.  3:06 Mailbox is full.

## 2015-05-31 ENCOUNTER — Encounter (HOSPITAL_COMMUNITY): Payer: Self-pay | Admitting: *Deleted

## 2015-05-31 ENCOUNTER — Emergency Department (HOSPITAL_COMMUNITY): Payer: Medicaid Other

## 2015-05-31 ENCOUNTER — Emergency Department (HOSPITAL_COMMUNITY)
Admission: EM | Admit: 2015-05-31 | Discharge: 2015-05-31 | Disposition: A | Discharge status: Home / Self Care | Payer: Medicaid Other | Attending: Emergency Medicine | Admitting: Emergency Medicine

## 2015-05-31 DIAGNOSIS — Z79899 Other long term (current) drug therapy: Secondary | ICD-10-CM | POA: Insufficient documentation

## 2015-05-31 DIAGNOSIS — Z3490 Encounter for supervision of normal pregnancy, unspecified, unspecified trimester: Secondary | ICD-10-CM

## 2015-05-31 DIAGNOSIS — Z87891 Personal history of nicotine dependence: Secondary | ICD-10-CM | POA: Insufficient documentation

## 2015-05-31 DIAGNOSIS — M25562 Pain in left knee: Secondary | ICD-10-CM

## 2015-05-31 DIAGNOSIS — Z8742 Personal history of other diseases of the female genital tract: Secondary | ICD-10-CM | POA: Diagnosis not present

## 2015-05-31 DIAGNOSIS — O9989 Other specified diseases and conditions complicating pregnancy, childbirth and the puerperium: Secondary | ICD-10-CM | POA: Insufficient documentation

## 2015-05-31 DIAGNOSIS — Z8619 Personal history of other infectious and parasitic diseases: Secondary | ICD-10-CM | POA: Insufficient documentation

## 2015-05-31 DIAGNOSIS — Z3A00 Weeks of gestation of pregnancy not specified: Secondary | ICD-10-CM | POA: Diagnosis not present
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
## 2015-05-31 NOTE — Discharge Instructions (Signed)
Knee Pain Knee pain is a very common symptom and can have many causes. Knee pain often goes away when you follow your health care provider's instructions for relieving pain and discomfort at home. However, knee pain can develop into a condition that needs treatment. Some conditions may include:  Arthritis caused by wear and tear (osteoarthritis).  Arthritis caused by swelling and irritation (rheumatoid arthritis or gout).  A cyst or growth in your knee.  An infection in your knee joint.  An injury that will not heal.  Damage, swelling, or irritation of the tissues that support your knee (torn ligaments or tendinitis). If your knee pain continues, additional tests may be ordered to diagnose your condition. Tests may include X-rays or other imaging studies of your knee. You may also need to have fluid removed from your knee. Treatment for ongoing knee pain depends on the cause, but treatment may include:  Medicines to relieve pain or swelling.  Steroid injections in your knee.  Physical therapy.  Surgery. HOME CARE INSTRUCTIONS  Take medicines only as directed by your health care provider.  Rest your knee and keep it raised (elevated) while you are resting.  Do not do things that cause or worsen pain.  Avoid high-impact activities or exercises, such as running, jumping rope, or doing jumping jacks.  Apply ice to the knee area:  Put ice in a plastic bag.  Place a towel between your skin and the bag.  Leave the ice on for 20 minutes, 2-3 times a day.  Ask your health care provider if you should wear an elastic knee support.  Keep a pillow under your knee when you sleep.  Lose weight if you are overweight. Extra weight can put pressure on your knee.  Do not use any tobacco products, including cigarettes, chewing tobacco, or electronic cigarettes. If you need help quitting, ask your health care provider. Smoking may slow the healing of any bone and joint problems that you may  have. SEEK MEDICAL CARE IF:  Your knee pain continues, changes, or gets worse.  You have a fever along with knee pain.  Your knee buckles or locks up.  Your knee becomes more swollen. SEEK IMMEDIATE MEDICAL CARE IF:   Your knee joint feels hot to the touch.  You have chest pain or trouble breathing.   This information is not intended to replace advice given to you by your health care provider. Make sure you discuss any questions you have with your health care provider.   Document Released: 01/17/2007 Document Revised: 04/12/2014 Document Reviewed: 11/05/2013 Elsevier Interactive Patient Education 2016 [employer].  First Trimester of Pregnancy The first trimester of pregnancy is from week 1 until the end of week 12 (months 1 through 3). A week after a sperm fertilizes an egg, the egg will implant on the wall of the uterus. This embryo will begin to develop into a baby. Genes from you and your partner are forming the baby. The female genes determine whether the baby is a boy or a girl. At 6-8 weeks, the eyes and face are formed, and the heartbeat can be seen on ultrasound. At the end of 12 weeks, all the baby's organs are formed.  Now that you are pregnant, you will want to do everything you can to have a healthy baby. Two of the most important things are to get good prenatal care and to follow your health care provider's instructions. Prenatal care is all the medical care you receive before the  baby's birth. This care will help prevent, find, and treat any problems during the pregnancy and childbirth. BODY CHANGES Your body goes through many changes during pregnancy. The changes vary from woman to woman.   You may gain or lose a couple of pounds at first.  You may feel sick to your stomach (nauseous) and throw up (vomit). If the vomiting is uncontrollable, call your health care provider.  You may tire easily.  You may develop headaches that can be relieved by medicines approved by  your health care provider.  You may urinate more often. Painful urination may mean you have a bladder infection.  You may develop heartburn as a result of your pregnancy.  You may develop constipation because certain hormones are causing the muscles that push waste through your intestines to slow down.  You may develop hemorrhoids or swollen, bulging veins (varicose veins).  Your breasts may begin to grow larger and become tender. Your nipples may stick out more, and the tissue that surrounds them (areola) may become darker.  Your gums may bleed and may be sensitive to brushing and flossing.  Dark spots or blotches (chloasma, mask of pregnancy) may develop on your face. This will likely fade after the baby is born.  Your menstrual periods will stop.  You may have a loss of appetite.  You may develop cravings for certain kinds of food.  You may have changes in your emotions from day to day, such as being excited to be pregnant or being concerned that something may go wrong with the pregnancy and baby.  You may have more vivid and strange dreams.  You may have changes in your hair. These can include thickening of your hair, rapid growth, and changes in texture. Some women also have hair loss during or after pregnancy, or hair that feels dry or thin. Your hair will most likely return to normal after your baby is born. WHAT TO EXPECT AT YOUR PRENATAL VISITS During a routine prenatal visit:  You will be weighed to make sure you and the baby are growing normally.  Your blood pressure will be taken.  Your abdomen will be measured to track your baby's growth.  The fetal heartbeat will be listened to starting around week 10 or 12 of your pregnancy.  Test results from any previous visits will be discussed. Your health care provider may ask you:  How you are feeling.  If you are feeling the baby move.  If you have had any abnormal symptoms, such as leaking fluid, bleeding, severe  headaches, or abdominal cramping.  If you are using any tobacco products, including cigarettes, chewing tobacco, and electronic cigarettes.  If you have any questions. Other tests that may be performed during your first trimester include:  Blood tests to find your blood type and to check for the presence of any previous infections. They will also be used to check for low iron levels (anemia) and Rh antibodies. Later in the pregnancy, blood tests for diabetes will be done along with other tests if problems develop.  Urine tests to check for infections, diabetes, or protein in the urine.  An ultrasound to confirm the proper growth and development of the baby.  An amniocentesis to check for possible genetic problems.  Fetal screens for spina bifida and Down syndrome.  You may need other tests to make sure you and the baby are doing well.  HIV (human immunodeficiency virus) testing. Routine prenatal testing includes screening for HIV, unless you choose  not to have this test. HOME CARE INSTRUCTIONS  Medicines  Follow your health care provider's instructions regarding medicine use. Specific medicines may be either safe or unsafe to take during pregnancy.  Take your prenatal vitamins as directed.  If you develop constipation, try taking a stool softener if your health care provider approves. Diet  Eat regular, well-balanced meals. Choose a variety of foods, such as meat or vegetable-based protein, fish, milk and low-fat dairy products, vegetables, fruits, and whole grain breads and cereals. Your health care provider will help you determine the amount of weight gain that is right for you.  Avoid raw meat and uncooked cheese. These carry germs that can cause birth defects in the baby.  Eating four or five small meals rather than three large meals a day may help relieve nausea and vomiting. If you start to feel nauseous, eating a few soda crackers can be helpful. Drinking liquids between  meals instead of during meals also seems to help nausea and vomiting.  If you develop constipation, eat more high-fiber foods, such as fresh vegetables or fruit and whole grains. Drink enough fluids to keep your urine clear or pale yellow. Activity and Exercise  Exercise only as directed by your health care provider. Exercising will help you:  Control your weight.  Stay in shape.  Be prepared for labor and delivery.  Experiencing pain or cramping in the lower abdomen or low back is a good sign that you should stop exercising. Check with your health care provider before continuing normal exercises.  Try to avoid standing for long periods of time. Move your legs often if you must stand in one place for a long time.  Avoid heavy lifting.  Wear low-heeled shoes, and practice good posture.  You may continue to have sex unless your health care provider directs you otherwise. Relief of Pain or Discomfort  Wear a good support bra for breast tenderness.   Take warm sitz baths to soothe any pain or discomfort caused by hemorrhoids. Use hemorrhoid cream if your health care provider approves.   Rest with your legs elevated if you have leg cramps or low back pain.  If you develop varicose veins in your legs, wear support hose. Elevate your feet for 15 minutes, 3-4 times a day. Limit salt in your diet. Prenatal Care  Schedule your prenatal visits by the twelfth week of pregnancy. They are usually scheduled monthly at first, then more often in the last 2 months before delivery.  Write down your questions. Take them to your prenatal visits.  Keep all your prenatal visits as directed by your health care provider. Safety  Wear your seat belt at all times when driving.  Make a list of emergency phone numbers, including numbers for family, friends, the hospital, and police and fire departments. General Tips  Ask your health care provider for a referral to a local prenatal education class.  Begin classes no later than at the beginning of month 6 of your pregnancy.  Ask for help if you have counseling or nutritional needs during pregnancy. Your health care provider can offer advice or refer you to specialists for help with various needs.  Do not use hot tubs, steam rooms, or saunas.  Do not douche or use tampons or scented sanitary pads.  Do not cross your legs for long periods of time.  Avoid cat litter boxes and soil used by cats. These carry germs that can cause birth defects in the baby and possibly loss  of the fetus by miscarriage or stillbirth.  Avoid all smoking, herbs, alcohol, and medicines not prescribed by your health care provider. Chemicals in these affect the formation and growth of the baby.  Do not use any tobacco products, including cigarettes, chewing tobacco, and electronic cigarettes. If you need help quitting, ask your health care provider. You may receive counseling support and other resources to help you quit.  Schedule a dentist appointment. At home, brush your teeth with a soft toothbrush and be gentle when you floss. SEEK MEDICAL CARE IF:   You have dizziness.  You have mild pelvic cramps, pelvic pressure, or nagging pain in the abdominal area.  You have persistent nausea, vomiting, or diarrhea.  You have a bad smelling vaginal discharge.  You have pain with urination.  You notice increased swelling in your face, hands, legs, or ankles. SEEK IMMEDIATE MEDICAL CARE IF:   You have a fever.  You are leaking fluid from your vagina.  You have spotting or bleeding from your vagina.  You have severe abdominal cramping or pain.  You have rapid weight gain or loss.  You vomit blood or material that looks like coffee grounds.  You are exposed to [place] measles and have never had them.  You are exposed to fifth disease or chickenpox.  You develop a severe headache.  You have shortness of breath.  You have any kind of trauma, such as from  a fall or a car accident.   This information is not intended to replace advice given to you by your health care provider. Make sure you discuss any questions you have with your health care provider.   Document Released: 03/16/2001 Document Revised: 04/12/2014 Document Reviewed: 01/30/2013 Elsevier Interactive Patient Education 2016 [employer] Inc[employer]  Back Pain in Pregnancy   Back pain during pregnancy is common. It happens in about half of all pregnancies. It is important for you and your baby that you remain active during your pregnancy. If you feel that back pain is not allowing you to remain active or sleep well, it is time to see your caregiver. Back pain may be caused by several factors related to changes during your pregnancy. Fortunately, unless you had trouble with your back before your pregnancy, the pain is likely to get better after you deliver.  Low back pain usually occurs between the fifth and seventh months of pregnancy. It can, however, happen in the first couple months. Factors that increase the risk of back problems include:  Previous back problems.  Injury to your back.  Having twins or multiple births.  A chronic cough.  Stress.  Job-related repetitive motions.  Muscle or spinal disease in the back.  Family history of back problems, ruptured (herniated) discs, or osteoporosis.  Depression, anxiety, and panic attacks. CAUSES  When you are pregnant, your body produces a hormone called relaxin. This hormone makes the ligaments connecting the low back and pubic bones more flexible. This flexibility allows the baby to be delivered more easily. When your ligaments are loose, your muscles need to work harder to support your back. Soreness in your back can come from tired muscles. Soreness can also come from back tissues that are irritated since they are receiving less support.  As the baby grows, it puts pressure on the nerves and blood vessels in your pelvis. This can cause back  pain.  As the baby grows and gets heavier during pregnancy, the uterus pushes the stomach muscles forward and changes your center of gravity.  This makes your back muscles work harder to maintain good posture. SYMPTOMS  Lumbar pain during pregnancy  Lumbar pain during pregnancy usually occurs at or above the waist in the center of the back. There may be pain and numbness that radiates into your leg or foot. This is similar to low back pain experienced by non-pregnant women. It usually increases with sitting for long periods of time, standing, or repetitive lifting. Tenderness may also be present in the muscles along your upper back.  Posterior pelvic pain during pregnancy  Pain in the back of the pelvis is more common than lumbar pain in pregnancy. It is a deep pain felt in your side at the waistline, or across the tailbone (sacrum), or in both places. You may have pain on one or both sides. This pain can also go into the buttocks and backs of the upper thighs. Pubic and groin pain may also be present. The pain does not quickly resolve with rest, and morning stiffness may also be present.  Pelvic pain during pregnancy can be brought on by most activities. A high level of fitness before and during pregnancy may or may not prevent this problem. Labor pain is usually 1 to 2 minutes apart, lasts for about 1 minute, and involves a bearing down feeling or pressure in your pelvis. However, if you are at term with the pregnancy, constant low back pain can be the beginning of early labor, and you should be aware of this.  DIAGNOSIS  X-rays of the back should not be done during the first 12 to 14 weeks of the pregnancy and only when absolutely necessary during the rest of the pregnancy. MRIs do not give off radiation and are safe during pregnancy. MRIs also should only be done when absolutely necessary.  HOME CARE INSTRUCTIONS  Exercise as directed by your caregiver. Exercise is the most effective way to prevent or  manage back pain. If you have a back problem, it is especially important to avoid sports that require sudden body movements. Swimming and walking are great activities.  Do not stand in one place for long periods of time.  Do not wear high heels.  Sit in chairs with good posture. Use a pillow on your lower back if necessary. Make sure your head rests over your shoulders and is not hanging forward.  Try sleeping on your side, preferably the left side, with a pillow or two between your legs. If you are sore after a night's rest, your bed may be too soft. Try placing a board between your mattress and box spring.  Listen to your body when lifting. If you are experiencing pain, ask for help or try bending your knees more so you can use your leg muscles rather than your back muscles. Squat down when picking up something from the floor. Do not bend over.  Eat a healthy diet. Try to gain weight within your caregiver's recommendations.  Use heat or cold packs 3 to 4 times a day for 15 minutes to help with the pain.  Only take over-the-counter or prescription medicines for pain, discomfort, or fever as directed by your caregiver. Sudden (acute) back pain  Use bed rest for only the most extreme, acute episodes of back pain. Prolonged bed rest over 48 hours will aggravate your condition.  Ice is very effective for acute conditions.  Put ice in a plastic bag.  Place a towel between your skin and the bag.  Leave the ice on for 10 to  20 minutes every 2 hours, or as needed. Using heat packs for 30 minutes prior to activities is also helpful. Continued back pain  See your caregiver if you have continued problems. Your caregiver can help or refer you for appropriate physical therapy. With conditioning, most back problems can be avoided. Sometimes, a more serious issue may be the cause of back pain. You should be seen right away if new problems seem to be developing. Your caregiver may recommend:  A maternity girdle.    An elastic sling.  A back brace.  A massage therapist or acupuncture. SEEK MEDICAL CARE IF:  You are not able to do most of your daily activities, even when taking the pain medicine you were given.  You need a referral to a physical therapist or chiropractor.  You want to try acupuncture. SEEK IMMEDIATE MEDICAL CARE IF:  You develop numbness, tingling, weakness, or problems with the use of your arms or legs.  You develop severe back pain that is no longer relieved with medicines.  You have a sudden change in bowel or bladder control.  You have increasing pain in other areas of the body.  You develop shortness of breath, dizziness, or fainting.  You develop nausea, vomiting, or sweating.  You have back pain which is similar to labor pains.  You have back pain along with your water breaking or vaginal bleeding.  You have back pain or numbness that travels down your leg.  Your back pain developed after you fell.  You develop pain on one side of your back. You may have a kidney stone.  You see blood in your urine. You may have a bladder infection or kidney stone.  You have back pain with blisters. You may have shingles. Back pain is fairly common during pregnancy but should not be accepted as just part of the process. Back pain should always be treated as soon as possible. This will make your pregnancy as pleasant as possible.  This information is not intended to replace advice given to you by your health care provider. Make sure you discuss any questions you have with your health care provider.  Document Released: 06/30/2005 Document Revised: 06/14/2011 Document Reviewed: 08/11/2010  Elsevier Interactive Patient Education [employer]

## 2015-05-31 NOTE — ED Notes (Signed)
Declined W/C at D/C and was escorted to lobby by RN. 

## 2015-06-03 ENCOUNTER — Encounter: Payer: Medicaid Other | Admitting: Obstetrics

## 2015-06-03 ENCOUNTER — Encounter: Payer: Self-pay | Admitting: Obstetrics

## 2015-06-03 ENCOUNTER — Ambulatory Visit (INDEPENDENT_AMBULATORY_CARE_PROVIDER_SITE_OTHER): Payer: Medicaid Other | Admitting: Obstetrics

## 2015-06-03 VITALS — BP 120/76 | HR 76 | Temp 97.6°F | Wt 121.0 lb

## 2015-06-03 DIAGNOSIS — Z3402 Encounter for supervision of normal first pregnancy, second trimester: Secondary | ICD-10-CM | POA: Diagnosis not present

## 2015-06-03 LAB — POCT URINALYSIS DIPSTICK
BILIRUBIN UA: NEGATIVE
Blood, UA: NEGATIVE
GLUCOSE UA: NEGATIVE
Ketones, UA: NEGATIVE
NITRITE UA: NEGATIVE
PH UA: 7
Protein, UA: NEGATIVE
Spec Grav, UA: 1.02
Urobilinogen, UA: NEGATIVE

## 2015-06-04 ENCOUNTER — Encounter: Payer: Self-pay | Admitting: Obstetrics

## 2015-06-04 LAB — OBSTETRIC PANEL
ANTIBODY SCREEN: NEGATIVE
Basophils Absolute: 0 10*3/uL (ref 0.0–0.1)
Basophils Relative: 0 % (ref 0–1)
EOS PCT: 1 % (ref 0–5)
Eosinophils Absolute: 0.1 10*3/uL (ref 0.0–0.7)
HCT: 38 % (ref 36.0–46.0)
Hemoglobin: 12.7 g/dL (ref 12.0–15.0)
Hepatitis B Surface Ag: NEGATIVE
LYMPHS ABS: 1.9 10*3/uL (ref 0.7–4.0)
LYMPHS PCT: 20 % (ref 12–46)
MCH: 28.9 pg (ref 26.0–34.0)
MCHC: 33.4 g/dL (ref 30.0–36.0)
MCV: 86.6 fL (ref 78.0–100.0)
MONO ABS: 0.6 10*3/uL (ref 0.1–1.0)
MPV: 9.2 fL (ref 8.6–12.4)
Monocytes Relative: 6 % (ref 3–12)
Neutro Abs: 6.8 10*3/uL (ref 1.7–7.7)
Neutrophils Relative %: 73 % (ref 43–77)
PLATELETS: 286 10*3/uL (ref 150–400)
RBC: 4.39 MIL/uL (ref 3.87–5.11)
RDW: 13.6 % (ref 11.5–15.5)
RH TYPE: POSITIVE
RUBELLA: 1.62 {index} — AB (ref <0.90)
WBC: 9.3 10*3/uL (ref 4.0–10.5)

## 2015-06-04 LAB — VITAMIN D 25 HYDROXY (VIT D DEFICIENCY, FRACTURES): Vit D, 25-Hydroxy: 17 ng/mL — ABNORMAL LOW (ref 30–100)

## 2015-06-04 LAB — HIV ANTIBODY (ROUTINE TESTING W REFLEX): HIV: NONREACTIVE

## 2015-06-04 LAB — VARICELLA ZOSTER ANTIBODY, IGG: Varicella IgG: 2657 Index — ABNORMAL HIGH (ref <135.00)

## 2015-06-04 NOTE — Progress Notes (Signed)
Subjective:    Tammy Gardner is being seen today for her first obstetrical visit.  This is not a planned pregnancy. She is at [redacted]w[redacted]d gestation. Her obstetrical history is significant for none. Relationship with FOB: significant other, not living together. Patient does intend to breast feed. Pregnancy history fully reviewed.  The information documented in the HPI was reviewed and verified.  Menstrual History: OB History    Gravida Para Term Preterm AB TAB SAB Ectopic Multiple Living         Patient's last menstrual period was 02/22/2015.    Past Medical History  Diagnosis Date  . Bacterial vaginosis   . Chlamydia   . Trichomonas   . Medical history non-contributory     Past Surgical History  Procedure Laterality Date  . Termination    . Induced abortion       (Not in a hospital admission) No Known Allergies  Social History  Substance Use Topics  . Smoking status: Former Smoker    Types: Cigarettes  . Smokeless tobacco: Never Used  . Alcohol Use: No     Comment: no use since beginning of pregnancy    Family History  Problem Relation Age of Onset  . Hypertension Mother      Review of Systems Constitutional: negative for weight loss Gastrointestinal: negative for vomiting Genitourinary:negative for genital lesions and vaginal discharge and dysuria Musculoskeletal:negative for back pain Behavioral/Psych: negative for abusive relationship, depression, illegal drug usage and tobacco use    Objective:    BP 120/76 mmHg  Pulse 76  Temp(Src) 97.6 F (36.4 C)  Wt 121 lb (54.885 kg)  LMP 02/22/2015 General Appearance:    Alert, cooperative, no distress, appears stated age  Head:    Normocephalic, without obvious abnormality, atraumatic  Eyes:    PERRL, conjunctiva/corneas clear, EOM's intact, fundi    benign, both eyes  Ears:    Normal TM's and external ear canals, both ears  Nose:   Nares normal, septum midline, mucosa normal, no drainage     or sinus tenderness  Throat:   Lips, mucosa, and tongue normal; teeth and gums normal  Neck:   Supple, symmetrical, trachea midline, no adenopathy;    thyroid:  no enlargement/tenderness/nodules; no carotid   bruit or JVD  Back:     Symmetric, no curvature, ROM normal, no CVA tenderness  Lungs:     Clear to auscultation bilaterally, respirations unlabored  Chest Wall:    No tenderness or deformity   Heart:    Regular rate and rhythm, S1 and S2 normal, no murmur, rub   or gallop  Breast Exam:    No tenderness, masses, or nipple abnormality  Abdomen:     Soft, non-tender, bowel sounds active all four quadrants,    no masses, no organomegaly  Genitalia:    Normal female without lesion, discharge or tenderness  Extremities:   Extremities normal, atraumatic, no cyanosis or edema  Pulses:   2+ and symmetric all extremities  Skin:   Skin color, texture, turgor normal, no rashes or lesions  Lymph nodes:   Cervical, supraclavicular, and axillary nodes normal  Neurologic:   CNII-XII intact, normal strength, sensation and reflexes    throughout      Lab Review Urine pregnancy test Labs reviewed yes Radiologic studies reviewed yes Assessment:    Pregnancy at [redacted]w[redacted]d weeks    Plan:      Prenatal vitamins.  Counseling provided  regarding continued use of seat belts, cessation of alcohol consumption, smoking or use of illicit drugs; infection precautions i.e., influenza/TDAP immunizations, toxoplasmosis,CMV, parvovirus, listeria and varicella; workplace safety, exercise during pregnancy; routine dental care, safe medications, sexual activity, hot tubs, saunas, pools, travel, caffeine use, fish and methlymercury, potential toxins, hair treatments, varicose veins Weight gain recommendations per IOM guidelines reviewed: underweight/BMI< 18.5--> gain 28 - 40 lbs; normal weight/BMI 18.5 - 24.9--> gain 25 - 35 lbs; overweight/BMI 25 - 29.9--> gain 15 - 25 lbs; obese/BMI >30->gain  11 - 20 lbs Problem  list reviewed and updated. FIRST/CF mutation testing/NIPT/QUAD SCREEN/fragile X/[name] population testing/Spinal muscular atrophy discussed: requested. Role of ultrasound in pregnancy discussed; fetal survey: requested. Amniocentesis discussed: not indicated. VBAC calculator score: VBAC consent form provided No orders of the defined types were placed in this encounter.   Orders Placed This Encounter  Procedures  . Culture, OB Urine  . Obstetric panel  . HIV antibody  . Hemoglobinopathy evaluation  . Varicella zoster antibody, IgG  . VITAMIN D 25 Hydroxy (Vit-D Deficiency, Fractures)  . POCT urinalysis dipstick    Follow up in 2 weeks.

## 2015-06-05 LAB — HEMOGLOBINOPATHY EVALUATION
HEMOGLOBIN OTHER: 0 %
HGB A: 97.4 % (ref 96.8–97.8)
Hgb A2 Quant: 2.6 % (ref 2.2–3.2)
Hgb F Quant: 0 % (ref 0.0–2.0)
Hgb S Quant: 0 %

## 2015-06-06 ENCOUNTER — Other Ambulatory Visit: Payer: Self-pay | Admitting: Obstetrics

## 2015-06-06 DIAGNOSIS — O2342 Unspecified infection of urinary tract in pregnancy, second trimester: Secondary | ICD-10-CM

## 2015-06-06 LAB — CULTURE, OB URINE

## 2015-06-06 MED ORDER — AMOXICILLIN-POT CLAVULANATE 875-125 MG PO TABS: 1.0000 | ORAL_TABLET | Freq: Two times a day (BID) | ORAL | Status: DC

## 2015-06-12 ENCOUNTER — Telehealth: Payer: Self-pay | Admitting: *Deleted

## 2015-06-12 NOTE — Telephone Encounter (Signed)
Patient is requesting a new nausea medication- she is miserable and missing work. She is also very drowsy on the present medication.

## 2015-06-13 ENCOUNTER — Encounter (HOSPITAL_COMMUNITY): Payer: Self-pay | Admitting: *Deleted

## 2015-06-13 ENCOUNTER — Inpatient Hospital Stay (HOSPITAL_COMMUNITY)
Admit: 2015-06-13 | Discharge: 2015-06-13 | Disposition: A | Discharge status: Home / Self Care | Payer: Medicaid Other | Source: Ambulatory Visit | Attending: Obstetrics | Admitting: Obstetrics

## 2015-06-13 ENCOUNTER — Other Ambulatory Visit: Payer: Self-pay | Admitting: Obstetrics

## 2015-06-13 DIAGNOSIS — J069 Acute upper respiratory infection, unspecified: Secondary | ICD-10-CM | POA: Diagnosis not present

## 2015-06-13 DIAGNOSIS — Z87891 Personal history of nicotine dependence: Secondary | ICD-10-CM | POA: Insufficient documentation

## 2015-06-13 DIAGNOSIS — Z3A15 15 weeks gestation of pregnancy: Secondary | ICD-10-CM | POA: Diagnosis not present

## 2015-06-13 DIAGNOSIS — O9989 Other specified diseases and conditions complicating pregnancy, childbirth and the puerperium: Secondary | ICD-10-CM | POA: Diagnosis not present

## 2015-06-13 DIAGNOSIS — B9789 Other viral agents as the cause of diseases classified elsewhere: Secondary | ICD-10-CM

## 2015-06-13 DIAGNOSIS — M549 Dorsalgia, unspecified: Secondary | ICD-10-CM | POA: Diagnosis present

## 2015-06-13 DIAGNOSIS — O99512 Diseases of the respiratory system complicating pregnancy, second trimester: Secondary | ICD-10-CM | POA: Diagnosis not present

## 2015-06-13 LAB — INFLUENZA PANEL BY PCR (TYPE A & B)
H1N1FLUPCR: NOT DETECTED
INFLAPCR: NEGATIVE
INFLBPCR: NEGATIVE

## 2015-06-13 LAB — URINALYSIS, ROUTINE W REFLEX MICROSCOPIC
Bilirubin Urine: NEGATIVE
GLUCOSE, UA: NEGATIVE mg/dL
Hgb urine dipstick: NEGATIVE
KETONES UR: 15 mg/dL — AB
LEUKOCYTES UA: NEGATIVE
NITRITE: NEGATIVE
Protein, ur: NEGATIVE mg/dL
Specific Gravity, Urine: 1.025 (ref 1.005–1.030)
pH: 6 (ref 5.0–8.0)

## 2015-06-13 NOTE — Discharge Instructions (Signed)

## 2015-06-13 NOTE — MAU Note (Addendum)
Just feel terrible, back hurts, has a headache- it's pounding.  Thinks she maybe had a fever yesterday.  No energy, nose is stopped up, maybe coming down with a cold. Denies sore throat or cough

## 2015-06-13 NOTE — MAU Provider Note (Signed)
History     CSN: [id number]  Arrival date and time: 06/13/15 1026   First Provider Initiated Contact with Patient 06/13/15 1137      Chief Complaint  Patient presents with  . Back Pain  . Headache  . Fatigue   HPI Ms. Tammy Gardner is a 25 y.o. G2P0010 at 10344w6d who presents to MAU today with complaint of URI symptoms. The patient states that she has been "feeling bad" x 3 days. She has nasal congestion, non-productive cough and sore throat. She also endorses feeling achy all over, prominent most in her back and occasional headaches. She states subjective fever only yesterday. She denies abdominal pain, vaginal bleeding or complications with the pregnancy.   OB History    Gravida Para Term Preterm AB TAB SAB Ectopic Multiple Living   2 0 0 0 1 1 0 0 0 0       Past Medical History  Diagnosis Date  . Bacterial vaginosis   . Chlamydia   . Trichomonas   . Medical history non-contributory     Past Surgical History  Procedure Laterality Date  . Termination    . Induced abortion      Family History  Problem Relation Age of Onset  . Hypertension Mother     Social History  Substance Use Topics  . Smoking status: Former Smoker    Types: Cigarettes  . Smokeless tobacco: Never Used  . Alcohol Use: No     Comment: no use since beginning of pregnancy    Allergies: No Known Allergies  Prescriptions prior to admission  Medication Sig Dispense Refill Last Dose  . amoxicillin-clavulanate (AUGMENTIN) 875-125 MG tablet Take 1 tablet by mouth 2 (two) times daily. 1 tablet po BID x10 days (Patient not taking: Reported on 06/13/2015) 14 tablet 0 Not Taking at Unknown time  . ondansetron (ZOFRAN) 4 MG tablet Take 1 tablet (4 mg total) by mouth every 6 (six) hours. (Patient not taking: Reported on 06/03/2015) 12 tablet 0 Not Taking  . promethazine (PHENERGAN) 12.5 MG tablet Take 1 tablet (12.5 mg total) by mouth every 6 (six) hours as needed for nausea or vomiting. (Patient not  taking: Reported on 05/06/2015) 30 tablet 3 Not Taking    Review of Systems  Constitutional: Positive for fever. Negative for chills and malaise/fatigue.  HENT: Positive for congestion and sore throat. Negative for ear pain.   Respiratory: Positive for cough. Negative for sputum production and shortness of breath.   Gastrointestinal: Negative for abdominal pain.  Genitourinary:       Neg - vaginal bleeding, discharge  Neurological: Positive for headaches. Negative for weakness.   Physical Exam   Blood pressure 118/62, pulse 83, temperature 98.6 F (37 C), temperature source Oral, resp. rate 16, weight 121 lb 3.2 oz (54.976 kg), last menstrual period 02/22/2015, SpO2 100 %.  Physical Exam  Nursing note and vitals reviewed. Constitutional: She is oriented to person, place, and time. She appears well-developed and well-nourished. No distress.  HENT:  Head: Normocephalic and atraumatic.  Right Ear: Tympanic membrane, external ear and ear canal normal.  Left Ear: Tympanic membrane, external ear and ear canal normal.  Nose: Mucosal edema and rhinorrhea present. Right sinus exhibits no maxillary sinus tenderness and no frontal sinus tenderness. Left sinus exhibits no maxillary sinus tenderness and no frontal sinus tenderness.  Mouth/Throat: Mucous membranes are normal. No oropharyngeal exudate, posterior oropharyngeal edema, posterior oropharyngeal erythema or tonsillar abscesses.  Eyes: EOM are normal.  Neck: Normal range  of motion. Neck supple.  Cardiovascular: Normal rate.   Respiratory: Effort normal.  GI: Soft. She exhibits no distension and no mass. There is no tenderness. There is no rebound and no guarding.  Lymphadenopathy:       Head (right side): No submental, no submandibular and no tonsillar adenopathy present.       Head (left side): No submental, no submandibular and no tonsillar adenopathy present.    She has no cervical adenopathy.  Neurological: She is alert and oriented  to person, place, and time.  Skin: Skin is warm and dry. No erythema.  Psychiatric: She has a normal mood and affect.   Results for orders placed or performed during the hospital encounter of 06/13/15 (from the past 24 hour(s))  Urinalysis, Routine w reflex microscopic (not at [facility])     Status: Abnormal   Collection Time: 06/13/15 10:50 AM  Result Value Ref Range   Color, Urine YELLOW YELLOW   APPearance HAZY (A) CLEAR   Specific Gravity, Urine 1.025 1.005 - 1.030   pH 6.0 5.0 - 8.0   Glucose, UA NEGATIVE NEGATIVE mg/dL   Hgb urine dipstick NEGATIVE NEGATIVE   Bilirubin Urine NEGATIVE NEGATIVE   Ketones, ur 15 (A) NEGATIVE mg/dL   Protein, ur NEGATIVE NEGATIVE mg/dL   Nitrite NEGATIVE NEGATIVE   Leukocytes, UA NEGATIVE NEGATIVE     MAU Course  Procedures None  MDM FHR - 155 bpm Flu swab obtained due to subjective fever, URI symptoms and body aches. Will call patient with positive results.  Assessment and Plan  A: Viral URI  P: Discharge home List of OTC meds given for symptomatic relief Encouraged increased rest and hydration Warning signs for worsening condition discussed Flu swab pending at time of discharge. Patient will be contacted with abnormal results Patient advised to follow-up with Dr. Clearance Coots as scheduled for routine prenatal care or sooner PRN Patient may return to MAU as needed or if her condition were to change or worsen   Marny Lowenstein, PA-C  06/13/2015, 11:37 AM

## 2015-06-17 ENCOUNTER — Ambulatory Visit (INDEPENDENT_AMBULATORY_CARE_PROVIDER_SITE_OTHER): Payer: Medicaid Other | Admitting: Obstetrics

## 2015-06-17 ENCOUNTER — Encounter: Payer: Self-pay | Admitting: Obstetrics

## 2015-06-17 VITALS — BP 111/71 | HR 74 | Temp 97.6°F | Wt 124.0 lb

## 2015-06-17 DIAGNOSIS — Z3402 Encounter for supervision of normal first pregnancy, second trimester: Secondary | ICD-10-CM

## 2015-06-17 DIAGNOSIS — N390 Urinary tract infection, site not specified: Secondary | ICD-10-CM

## 2015-06-17 LAB — POCT URINALYSIS DIPSTICK
BILIRUBIN UA: NEGATIVE
GLUCOSE UA: NEGATIVE
Ketones, UA: NEGATIVE
Leukocytes, UA: NEGATIVE
NITRITE UA: NEGATIVE
PH UA: 6
Protein, UA: NEGATIVE
RBC UA: NEGATIVE
SPEC GRAV UA: 1.015
UROBILINOGEN UA: NEGATIVE

## 2015-06-17 MED ORDER — AMOXICILLIN-POT CLAVULANATE 875-125 MG PO TABS: 1.0000 | ORAL_TABLET | Freq: Two times a day (BID) | ORAL | Status: DC

## 2015-06-17 NOTE — Progress Notes (Signed)
  Subjective:    Tammy Gardner is a 25 y.o. female being seen today for her obstetrical visit. She is at 7435w3d gestation. Patient reports: no complaints.  Problem List Items Addressed This Visit    None    Visit Diagnoses    Encounter for supervision of normal first pregnancy in second trimester    -  Primary    Relevant Orders    POCT urinalysis dipstick (Completed)    US OB Comp + 14 Wk    UTI (lower urinary tract infection)        Relevant Medications    amoxicillin-clavulanate (AUGMENTIN) 875-125 MG tablet      There are no active problems to display for this patient.   Objective:     BP 111/71 mmHg  Pulse 74  Temp(Src) 97.6 F (36.4 C)  Wt 124 lb (56.246 kg)  LMP 02/22/2015 Uterine Size: Below umbilicus     Assessment:    Pregnancy @ 5235w3d  weeks Doing well    Plan:    Problem list reviewed and updated. Labs reviewed.  Follow up in 4 weeks. FIRST/CF mutation testing/NIPT/QUAD SCREEN/fragile X/[name] population testing/Spinal muscular atrophy discussed: requested. Role of ultrasound in pregnancy discussed; fetal survey: requested. Amniocentesis discussed: not indicated.

## 2015-07-08 ENCOUNTER — Other Ambulatory Visit: Payer: Self-pay | Admitting: Certified Nurse Midwife

## 2015-07-10 ENCOUNTER — Ambulatory Visit (INDEPENDENT_AMBULATORY_CARE_PROVIDER_SITE_OTHER): Payer: Medicaid Other

## 2015-07-10 ENCOUNTER — Ambulatory Visit (INDEPENDENT_AMBULATORY_CARE_PROVIDER_SITE_OTHER): Payer: Medicaid Other | Admitting: Obstetrics

## 2015-07-10 ENCOUNTER — Encounter: Payer: Self-pay | Admitting: Obstetrics

## 2015-07-10 VITALS — BP 123/75 | HR 81 | Temp 97.1°F | Wt 129.0 lb

## 2015-07-10 DIAGNOSIS — Z36 Encounter for antenatal screening of mother: Secondary | ICD-10-CM | POA: Diagnosis not present

## 2015-07-10 DIAGNOSIS — Z3492 Encounter for supervision of normal pregnancy, unspecified, second trimester: Secondary | ICD-10-CM

## 2015-07-10 DIAGNOSIS — Z3402 Encounter for supervision of normal first pregnancy, second trimester: Secondary | ICD-10-CM

## 2015-07-10 LAB — POCT URINALYSIS DIPSTICK
GLUCOSE UA: NEGATIVE
KETONES UA: NEGATIVE
Leukocytes, UA: NEGATIVE
Nitrite, UA: NEGATIVE
Protein, UA: NEGATIVE
RBC UA: NEGATIVE
UROBILINOGEN UA: NEGATIVE
pH, UA: 6.5

## 2015-07-10 NOTE — Progress Notes (Signed)
Subjective:    Tammy Gardner is a 25 y.o. female being seen today for her obstetrical visit. She is at 2240w5d gestation. Patient reports: no complaints . Fetal movement: normal.  Problem List Items Addressed This Visit    None    Visit Diagnoses    Prenatal care, second trimester    -  Primary    Relevant Orders    POCT urinalysis dipstick (Completed)      There are no active problems to display for this patient.  Objective:    BP 123/75 mmHg  Pulse 81  Temp(Src) 97.1 F (36.2 C)  Wt 129 lb (58.514 kg)  LMP 02/22/2015 FHT: 150 BPM  Uterine Size: size equals dates     Assessment:    Pregnancy @ 3840w5d    Plan:    Signs and symptoms of preterm labor: discussed.  Labs, problem list reviewed and updated 2 hr GTT planned Follow up in 4 weeks.

## 2015-07-10 NOTE — Progress Notes (Signed)
Patient has no concerns 

## 2015-07-25 ENCOUNTER — Encounter (HOSPITAL_COMMUNITY): Payer: Self-pay

## 2015-07-25 ENCOUNTER — Inpatient Hospital Stay (HOSPITAL_COMMUNITY)
Admission: AD | Admit: 2015-07-25 | Discharge: 2015-07-25 | Disposition: A | Discharge status: Home / Self Care | Payer: Medicaid Other | Source: Ambulatory Visit | Attending: Obstetrics | Admitting: Obstetrics

## 2015-07-25 DIAGNOSIS — O26892 Other specified pregnancy related conditions, second trimester: Secondary | ICD-10-CM | POA: Insufficient documentation

## 2015-07-25 DIAGNOSIS — L299 Pruritus, unspecified: Secondary | ICD-10-CM | POA: Diagnosis not present

## 2015-07-25 DIAGNOSIS — Z3A21 21 weeks gestation of pregnancy: Secondary | ICD-10-CM | POA: Insufficient documentation

## 2015-07-25 DIAGNOSIS — Z87891 Personal history of nicotine dependence: Secondary | ICD-10-CM | POA: Insufficient documentation

## 2015-07-25 LAB — URINALYSIS, ROUTINE W REFLEX MICROSCOPIC
Bilirubin Urine: NEGATIVE
Glucose, UA: NEGATIVE mg/dL
Hgb urine dipstick: NEGATIVE
Ketones, ur: NEGATIVE mg/dL
LEUKOCYTES UA: NEGATIVE
NITRITE: NEGATIVE
PH: 7 (ref 5.0–8.0)
Protein, ur: NEGATIVE mg/dL
SPECIFIC GRAVITY, URINE: 1.01 (ref 1.005–1.030)

## 2015-07-25 NOTE — Discharge Instructions (Signed)
Pruritus °Pruritus is an itching feeling. There are many different conditions and factors that can make your skin itchy. Dry skin is one of the most common causes of itching. Most cases of itching do not require medical attention. Itchy skin can turn into a rash.  °HOME CARE INSTRUCTIONS  °Watch your pruritus for any changes. Take these steps to help with your condition:  °Skin Care °· Moisturize your skin as needed. A moisturizer that contains petroleum jelly is best for keeping moisture in your skin. °· Take or apply medicines only as directed by your health care provider. This may include: °¨ Corticosteroid cream. °¨ Anti-itch lotions. °¨ Oral anti-histamines. °· Apply cool compresses to the affected areas. °· Try taking a bath with: °¨ Epsom salts. Follow the instructions on the packaging. You can get these at your local pharmacy or grocery store. °¨ Baking soda. Pour a small amount into the bath as directed by your health care provider. °¨ Colloidal oatmeal. Follow the instructions on the packaging. You can get this at your local pharmacy or grocery store. °· Try applying baking soda paste to your skin. Stir water into baking soda until it reaches a paste-like consistency.   °· Do not scratch your skin. °· Avoid hot showers or baths, which can make itching worse. A cold shower may help with itching as long as you use a moisturizer after. °· Avoid scented soaps, detergents, and perfumes. Use gentle soaps, detergents, perfumes, and other cosmetic products. °General Instructions °· Avoid wearing tight clothes. °· Keep a journal to help track what causes your itch. Write down: °¨ What you eat. °¨ What cosmetic products you use. °¨ What you drink. °¨ What you wear. This includes jewelry. °· Use a humidifier. This keeps the air moist, which helps to prevent dry skin. °SEEK MEDICAL CARE IF: °· The itching does not go away after several days. °· You sweat at night. °· You have weight loss. °· You are unusually  thirsty. °· You urinate more than normal. °· You are more tired than normal. °· You have abdominal pain. °· Your skin tingles. °· You feel weak. °· Your skin or the whites of your eyes look yellow (jaundice). °· Your skin feels numb. °  °This information is not intended to replace advice given to you by your health care provider. Make sure you discuss any questions you have with your health care provider. °  °Document Released: 12/02/2010 Document Revised: 08/06/2014 Document Reviewed: 03/18/2014 °Elsevier Interactive Patient Education ©2016 [employer] ° °

## 2015-07-25 NOTE — MAU Provider Note (Signed)
  History     CSN: [id number]  Arrival date and time: 07/25/15 1743   First Provider Initiated Contact with Patient 07/25/15 1818      Chief Complaint  Patient presents with  . Pruritis   HPI  Collier Bullockraya D Ceniceros 25 y.o. G2P0010 @ 6616w6d presents to the MAU after finding a spider crawling in her hair and 2 brown bugs crawling on her arm. She has many braids in her hair. She denies having itchy skin. States she wants to make sure she does not have bugs in her hair.  Past Medical History  Diagnosis Date  . Bacterial vaginosis   . Chlamydia   . Trichomonas   . Medical history non-contributory     Past Surgical History  Procedure Laterality Date  . Termination    . Induced abortion      Family History  Problem Relation Age of Onset  . Hypertension Mother     Social History  Substance Use Topics  . Smoking status: Former Smoker    Types: Cigarettes  . Smokeless tobacco: Never Used  . Alcohol Use: No     Comment: no use since beginning of pregnancy    Allergies: No Known Allergies  Prescriptions prior to admission  Medication Sig Dispense Refill Last Dose  . Prenatal MV-Min-Fe Fum-FA-DHA (PRENATAL 1 PO) Take by mouth. Gummies       Review of Systems  Constitutional: Negative for fever.  All other systems reviewed and are negative.  Physical Exam   Blood pressure 133/64, temperature 98.2 F (36.8 C), temperature source Oral, resp. rate 16, height 4\' 11"  (1.499 m), weight 133 lb 6.4 oz (60.51 kg), last menstrual period 02/22/2015.  Physical Exam  Nursing note and vitals reviewed. Constitutional: She is oriented to person, place, and time. She appears well-developed and well-nourished. No distress.  HENT:  Head: Normocephalic and atraumatic.  thorough exam of scalp reveals normal scalp with no infestation.  Cardiovascular: Normal rate and regular rhythm.   Respiratory: Effort normal and breath sounds normal. No respiratory distress.  GI: Soft. There is no  tenderness.  Neurological: She is alert and oriented to person, place, and time.  Skin: Skin is warm and dry.  Psychiatric: She has a normal mood and affect. Her behavior is normal. Judgment and thought content normal.   Results for orders placed or performed during the hospital encounter of 07/25/15 (from the past 24 hour(s))  Urinalysis, Routine w reflex microscopic (not at [facility])     Status: None   Collection Time: 07/25/15  5:55 PM  Result Value Ref Range   Color, Urine YELLOW YELLOW   APPearance CLEAR CLEAR   Specific Gravity, Urine 1.010 1.005 - 1.030   pH 7.0 5.0 - 8.0   Glucose, UA NEGATIVE NEGATIVE mg/dL   Hgb urine dipstick NEGATIVE NEGATIVE   Bilirubin Urine NEGATIVE NEGATIVE   Ketones, ur NEGATIVE NEGATIVE mg/dL   Protein, ur NEGATIVE NEGATIVE mg/dL   Nitrite NEGATIVE NEGATIVE   Leukocytes, UA NEGATIVE NEGATIVE    MAU Course  Procedures    Assessment and Plan  Itchy scalp  Discharge  Odyssey Vasbinder Grissett 07/25/2015, 6:40 PM

## 2015-07-25 NOTE — MAU Note (Signed)
Started itching at work saw a couple of small brown bugs on her skin, had a tiny spider in her.

## 2015-08-07 ENCOUNTER — Ambulatory Visit (INDEPENDENT_AMBULATORY_CARE_PROVIDER_SITE_OTHER): Payer: Medicaid Other | Admitting: Obstetrics

## 2015-08-07 ENCOUNTER — Encounter: Payer: Self-pay | Admitting: Obstetrics

## 2015-08-07 VITALS — BP 122/81 | HR 82 | Wt 136.0 lb

## 2015-08-07 DIAGNOSIS — Z3402 Encounter for supervision of normal first pregnancy, second trimester: Secondary | ICD-10-CM

## 2015-08-07 LAB — POCT URINALYSIS DIPSTICK
BILIRUBIN UA: NEGATIVE
GLUCOSE UA: NEGATIVE
Ketones, UA: NEGATIVE
Leukocytes, UA: NEGATIVE
NITRITE UA: NEGATIVE
Protein, UA: NEGATIVE
RBC UA: NEGATIVE
Spec Grav, UA: 1.01
Urobilinogen, UA: NEGATIVE
pH, UA: 6.5

## 2015-08-07 NOTE — Progress Notes (Signed)
Subjective:    Tammy Gardner is a 25 y.o. female being seen today for her obstetrical visit. She is at 7462w5d gestation. Patient reports: no complaints . Fetal movement: normal.  Problem List Items Addressed This Visit    None    Visit Diagnoses    Encounter for supervision of normal first pregnancy in second trimester    -  Primary    Relevant Orders    POCT urinalysis dipstick (Completed)      There are no active problems to display for this patient.  Objective:    BP 122/81 mmHg  Pulse 82  Wt 136 lb (61.689 kg)  LMP 02/22/2015 FHT: 150 BPM  Uterine Size: size equals dates     Assessment:    Pregnancy @ 6362w5d    Plan:    OBGCT: ordered for next visit. Signs and symptoms of preterm labor: discussed.  Labs, problem list reviewed and updated 2 hr GTT planned Follow up in 4 weeks.

## 2015-08-08 ENCOUNTER — Telehealth: Payer: Self-pay

## 2015-08-08 NOTE — Telephone Encounter (Signed)
FMLA paperwork ready - 15.00 charge - left vm with patient

## 2015-09-04 ENCOUNTER — Ambulatory Visit (INDEPENDENT_AMBULATORY_CARE_PROVIDER_SITE_OTHER): Payer: Self-pay | Admitting: Obstetrics

## 2015-09-04 ENCOUNTER — Other Ambulatory Visit: Payer: Medicaid Other

## 2015-09-04 VITALS — BP 125/80 | HR 79 | Wt 142.0 lb

## 2015-09-04 DIAGNOSIS — Z3493 Encounter for supervision of normal pregnancy, unspecified, third trimester: Secondary | ICD-10-CM

## 2015-09-04 DIAGNOSIS — Z029 Encounter for administrative examinations, unspecified: Secondary | ICD-10-CM

## 2015-09-04 LAB — POCT URINALYSIS DIPSTICK
BILIRUBIN UA: NEGATIVE
GLUCOSE UA: NEGATIVE
KETONES UA: NEGATIVE
Leukocytes, UA: NEGATIVE
NITRITE UA: NEGATIVE
PH UA: 7
Protein, UA: NEGATIVE
RBC UA: NEGATIVE
SPEC GRAV UA: 1.01
Urobilinogen, UA: NEGATIVE

## 2015-09-05 LAB — CBC
HEMATOCRIT: 35.4 % (ref 34.0–46.6)
Hemoglobin: 12 g/dL (ref 11.1–15.9)
MCH: 29.5 pg (ref 26.6–33.0)
MCHC: 33.9 g/dL (ref 31.5–35.7)
MCV: 87 fL (ref 79–97)
PLATELETS: 247 10*3/uL (ref 150–379)
RBC: 4.07 x10E6/uL (ref 3.77–5.28)
RDW: 13.9 % (ref 12.3–15.4)
WBC: 11 10*3/uL — AB (ref 3.4–10.8)

## 2015-09-05 LAB — HIV ANTIBODY (ROUTINE TESTING W REFLEX): HIV SCREEN 4TH GENERATION: NONREACTIVE

## 2015-09-05 LAB — RPR: RPR: NONREACTIVE

## 2015-09-05 LAB — GLUCOSE TOLERANCE, 2 HOURS W/ 1HR
GLUCOSE, 1 HOUR: 110 mg/dL (ref 65–179)
GLUCOSE, 2 HOUR: 115 mg/dL (ref 65–152)
Glucose, Fasting: 72 mg/dL (ref 65–91)

## 2015-09-06 ENCOUNTER — Encounter: Payer: Self-pay | Admitting: Obstetrics

## 2015-09-06 NOTE — Progress Notes (Signed)
Subjective:    Tammy Gardner is a 25 y.o. female being seen today for her obstetrical visit. She is at 4452w0d gestation. Patient reports no complaints. Fetal movement: normal.  Problem List Items Addressed This Visit    None    Visit Diagnoses    Prenatal care, second trimester    -  Primary    Relevant Orders    POCT urinalysis dipstick (Completed)    Glucose Tolerance, 2 Hours w/1 Hour (Completed)    CBC (Completed)    HIV antibody (Completed)    RPR (Completed)      There are no active problems to display for this patient.  Objective:    BP 125/80 mmHg  Pulse 79  Wt 142 lb (64.411 kg)  LMP 02/22/2015 FHT:  150 BPM  Uterine Size: size equals dates  Presentation: unsure     Assessment:    Pregnancy @ 3552w0d weeks   Plan:     labs reviewed, problem list updated Consent signed. GBS sent TDAP offered  Rhogam given for RH negative Pediatrician: discussed. Infant feeding: plans to breastfeed. Cigarette smokinformer smokerformer smoker. Orders Placed This Encounter  Procedures  . Glucose Tolerance, 2 Hours w/1 Hour  . CBC  . HIV antibody  . RPR  . POCT urinalysis dipstick   No orders of the defined types were placed in this encounter.   Follow up in 2 Weeks.

## 2015-09-22 ENCOUNTER — Encounter: Payer: Medicaid Other | Admitting: Obstetrics

## 2015-09-22 ENCOUNTER — Ambulatory Visit: Payer: Medicaid Other | Admitting: Obstetrics

## 2015-09-22 VITALS — BP 119/84 | HR 77 | Temp 98.0°F | Wt 143.4 lb

## 2015-09-22 LAB — POCT URINALYSIS DIPSTICK
BILIRUBIN UA: NEGATIVE
Blood, UA: NEGATIVE
Glucose, UA: NEGATIVE
KETONES UA: NEGATIVE
LEUKOCYTES UA: NEGATIVE
Nitrite, UA: NEGATIVE
PH UA: 7
Spec Grav, UA: 1.015
Urobilinogen, UA: NEGATIVE

## 2015-09-22 NOTE — Progress Notes (Signed)
Patient is doing well today.  

## 2015-09-25 NOTE — Progress Notes (Signed)
Appt. Cancelled

## 2015-10-02 ENCOUNTER — Encounter (HOSPITAL_COMMUNITY): Payer: Self-pay

## 2015-10-02 ENCOUNTER — Inpatient Hospital Stay (HOSPITAL_COMMUNITY)
Admission: AD | Admit: 2015-10-02 | Discharge: 2015-10-02 | Disposition: A | Discharge status: Home / Self Care | Payer: Medicaid Other | Source: Ambulatory Visit | Attending: Obstetrics | Admitting: Obstetrics

## 2015-10-02 DIAGNOSIS — Z3A31 31 weeks gestation of pregnancy: Secondary | ICD-10-CM | POA: Insufficient documentation

## 2015-10-02 DIAGNOSIS — Z87891 Personal history of nicotine dependence: Secondary | ICD-10-CM | POA: Diagnosis not present

## 2015-10-02 DIAGNOSIS — O4703 False labor before 37 completed weeks of gestation, third trimester: Secondary | ICD-10-CM

## 2015-10-02 LAB — URINALYSIS, ROUTINE W REFLEX MICROSCOPIC
BILIRUBIN URINE: NEGATIVE
Glucose, UA: NEGATIVE mg/dL
HGB URINE DIPSTICK: NEGATIVE
Ketones, ur: 15 mg/dL — AB
Leukocytes, UA: NEGATIVE
NITRITE: NEGATIVE
PROTEIN: NEGATIVE mg/dL
SPECIFIC GRAVITY, URINE: 1.02 (ref 1.005–1.030)
pH: 7 (ref 5.0–8.0)

## 2015-10-02 LAB — FETAL FIBRONECTIN: FETAL FIBRONECTIN: NEGATIVE

## 2015-10-02 MED ORDER — NIFEDIPINE 10 MG PO CAPS
10.0000 mg | ORAL_CAPSULE | Freq: Once | ORAL | Status: AC
  Administered 2015-10-02: 10 mg via ORAL
  Filled 2015-10-02: qty 1

## 2015-10-02 NOTE — MAU Note (Signed)
Pt reports she is having abd pain and cramping off and on since last night.. Denies vag bleeding or discharge

## 2015-10-02 NOTE — MAU Provider Note (Signed)
Chief Complaint:  Abdominal Cramping   Provider saw patient at 1600 hours   HPI   Tammy Gardner is a 25 y.o. G2P0010 at 2631w5dwho presents to maternity admissions reporting abdominal pain and cramping since last night.  Denies leaking of fluid or bleeding. She reports good fetal movement, denies LOF, vaginal bleeding, vaginal itching/burning, urinary symptoms, h/a, dizziness, n/v, diarrhea, constipation or fever/chills.  She denies headache, visual changes or RUQ abdominal pain.  RN Note: Pt reports she is having abd pain and cramping off and on since last night.. Denies vag bleeding or discharge          Past Medical History: Past Medical History  Diagnosis Date  . Bacterial vaginosis   . Chlamydia   . Trichomonas   . Medical history non-contributory     Past obstetric history: OB History  Gravida Para Term Preterm AB SAB TAB Ectopic Multiple Living  2 0 0 0 1 0 1 0 0 0     # Outcome Date GA Lbr Len/2nd Weight Sex Delivery Anes PTL Lv  2 Current           1 TAB 2014              Past Surgical History: Past Surgical History  Procedure Laterality Date  . Termination    . Induced abortion      Family History: Family History  Problem Relation Age of Onset  . Hypertension Mother     Social History: Social History  Substance Use Topics  . Smoking status: Former Smoker    Types: Cigarettes  . Smokeless tobacco: Never Used  . Alcohol Use: No     Comment: no use since beginning of pregnancy    Allergies: No Known Allergies  Meds:  Prescriptions prior to admission  Medication Sig Dispense Refill Last Dose  . Prenatal Vit-Fe Fumarate-FA (PRENATAL MULTIVITAMIN) TABS tablet Take 1 tablet by mouth daily at 12 noon.   Past Week at Unknown time    I have reviewed patient's Past Medical Hx, Surgical Hx, Family Hx, Social Hx, medications and allergies.   ROS:  Review of Systems  Constitutional: Negative for fever and chills.  Gastrointestinal: Positive for  abdominal pain. Negative for nausea, vomiting, diarrhea and constipation.  Genitourinary: Negative for dysuria and vaginal bleeding.  Musculoskeletal: Negative for back pain.  Neurological: Negative for dizziness.   Other systems negative  Physical Exam  Patient Vitals for the past 24 hrs:  BP Temp Temp src Pulse Resp Height Weight  10/02/15 1345 128/72 mmHg 98.3 F (36.8 C) Oral 78 18 5' (1.524 m) 144 lb 9.6 oz (65.59 kg)   Constitutional: Well-developed, well-nourished female in no acute distress.  Cardiovascular: normal rate and rhythm Respiratory: normal effort, clear to auscultation bilaterally GI: Abd soft, non-tender, gravid appropriate for gestational age.   No rebound or guarding. MS: Extremities nontender, no edema, normal ROM Neurologic: Alert and oriented x 4.  GU: Neg CVAT.  PELVIC EXAM: Bimanual exam: neg CMT, uterus nontender, Fundal Height consistent with dates, adnexa without tenderness, enlargement, or mass  Dilation: Closed Effacement (%): 40 Station: [place], -3 Exam by:: Wynelle BourgeoisMarie Williams CNM   FHT:  Baseline 140 , moderate variability, accelerations present, no decelerations Contractions: q 3 mins   Labs: Results for orders placed or performed during the hospital encounter of 10/02/15 (from the past 72 hour(s))  Urinalysis, Routine w reflex microscopic (not at [facility])     Status: Abnormal   Collection Time: 10/02/15  1:45  PM  Result Value Ref Range   Color, Urine YELLOW YELLOW   APPearance CLEAR CLEAR   Specific Gravity, Urine 1.020 1.005 - 1.030   pH 7.0 5.0 - 8.0   Glucose, UA NEGATIVE NEGATIVE mg/dL   Hgb urine dipstick NEGATIVE NEGATIVE   Bilirubin Urine NEGATIVE NEGATIVE   Ketones, ur 15 (A) NEGATIVE mg/dL   Protein, ur NEGATIVE NEGATIVE mg/dL   Nitrite NEGATIVE NEGATIVE   Leukocytes, UA NEGATIVE NEGATIVE    Comment: MICROSCOPIC NOT DONE ON URINES WITH NEGATIVE PROTEIN, BLOOD, LEUKOCYTES, NITRITE, OR GLUCOSE <1000 mg/dL.  Fetal fibronectin      Status: None   Collection Time: 10/02/15  4:17 PM  Result Value Ref Range   Fetal Fibronectin NEGATIVE NEGATIVE    Imaging:  No results found.  MAU Course/MDM: I have ordered labs and reviewed results.  NST reviewed  Treatments in MAU included Procardia x 1 which produced a marked lessening of frequency and strength.    Assessment: SIUP at 6770w6d  Preterm  Contractions with no change in cervix and negative fetal fibronectin result Reassuring fetal heart rate tracing  Plan: Discharge home Preterm Labor precautions and fetal kick counts Recommend increase PO hydration Follow up in Office for prenatal visits and recheck    Medication List    ASK your doctor about these medications        prenatal multivitamin Tabs tablet  Take 1 tablet by mouth daily at 12 noon.       Pt stable at time of discharge.  Encouraged to return here or to other Urgent Care/ED if she develops worsening of symptoms, increase in pain, fever, or other concerning symptoms.      Wynelle BourgeoisMarie Williams CNM, MSN Certified Nurse-Midwife 10/02/2015 4:23 PM

## 2015-10-02 NOTE — MAU Note (Signed)
Not in Lobby x1

## 2015-10-02 NOTE — Discharge Instructions (Signed)
Preterm Labor Information °Preterm labor is when labor starts at less than 37 weeks of pregnancy. The normal length of a pregnancy is 39 to 41 weeks. °CAUSES °Often, there is no identifiable underlying cause as to why a woman goes into preterm labor. One of the most common known causes of preterm labor is infection. Infections of the uterus, cervix, vagina, amniotic sac, bladder, kidney, or even the lungs (pneumonia) can cause labor to start. Other suspected causes of preterm labor include:  °· Urogenital infections, such as yeast infections and bacterial vaginosis.   °· Uterine abnormalities (uterine shape, uterine septum, fibroids, or bleeding from the placenta).   °· A cervix that has been operated on (it may fail to stay closed).   °· Malformations in the fetus.   °· Multiple gestations (twins, triplets, and so on).   °· Breakage of the amniotic sac.   °RISK FACTORS °· Having a previous history of preterm labor.   °· Having premature rupture of membranes (PROM).   °· Having a placenta that covers the opening of the cervix (placenta previa).   °· Having a placenta that separates from the uterus (placental abruption).   °· Having a cervix that is too weak to hold the fetus in the uterus (incompetent cervix).   °· Having too much fluid in the amniotic sac (polyhydramnios).   °· Taking illegal drugs or smoking while pregnant.   °· Not gaining enough weight while pregnant.   °· Being younger than 18 and older than 24 years old.   °· Having a low socioeconomic status.   °· Being African American. °SYMPTOMS °Signs and symptoms of preterm labor include:  °· Menstrual-like cramps, abdominal pain, or back pain. °· Uterine contractions that are regular, as frequent as six in an hour, regardless of their intensity (may be mild or painful). °· Contractions that start on the top of the uterus and spread down to the lower abdomen and back.   °· A sense of increased pelvic pressure.   °· A watery or bloody mucus discharge that  comes from the vagina.   °TREATMENT °Depending on the length of the pregnancy and other circumstances, your health care provider may suggest bed rest. If necessary, there are medicines that can be given to stop contractions and to mature the fetal lungs. If labor happens before 34 weeks of pregnancy, a prolonged hospital stay may be recommended. Treatment depends on the condition of both you and the fetus.  °WHAT SHOULD YOU DO IF YOU THINK YOU ARE IN PRETERM LABOR? °Call your health care provider right away. You will need to go to the hospital to get checked immediately. °HOW CAN YOU PREVENT PRETERM LABOR IN FUTURE PREGNANCIES? °You should:  °· Stop smoking if you smoke.  °· Maintain healthy weight gain and avoid chemicals and drugs that are not necessary. °· Be watchful for any type of infection. °· Inform your health care provider if you have a known history of preterm labor. °  °This information is not intended to replace advice given to you by your health care provider. Make sure you discuss any questions you have with your health care provider. °  °Document Released: 06/12/2003 Document Revised: 11/22/2012 Document Reviewed: 04/24/2012 °Elsevier Interactive Patient Education ©2016 Elsevier Inc[employer] ° °Pelvic Rest °Pelvic rest is sometimes recommended for women when:  °· The placenta is partially or completely covering the opening of the cervix (placenta previa). °· There is bleeding between the uterine wall and the amniotic sac in the first trimester (subchorionic hemorrhage). °· The cervix begins to open without labor starting (incompetent cervix, cervical insufficiency). °· The   labor is too early (preterm labor). °HOME CARE INSTRUCTIONS °· Do not have sexual intercourse, stimulation, or an orgasm. °· Do not use tampons, douche, or put anything in the vagina. °· Do not lift anything over 10 pounds (4.5 kg). °· Avoid strenuous activity or straining your pelvic muscles. °SEEK MEDICAL CARE IF:  °· You have any  vaginal bleeding during pregnancy. Treat this as a potential emergency. °· You have cramping pain felt low in the stomach (stronger than menstrual cramps). °· You notice vaginal discharge (watery, mucus, or bloody). °· You have a low, dull backache. °· There are regular contractions or uterine tightening. °SEEK IMMEDIATE MEDICAL CARE IF: °You have vaginal bleeding and have placenta previa.  °  °This information is not intended to replace advice given to you by your health care provider. Make sure you discuss any questions you have with your health care provider. °  °Document Released: 07/17/2010 Document Revised: 06/14/2011 Document Reviewed: 09/23/2014 °Elsevier Interactive Patient Education ©2016 Elsevier Inc. ° °

## 2015-10-06 ENCOUNTER — Encounter: Payer: Medicaid Other | Admitting: Obstetrics

## 2015-10-08 ENCOUNTER — Inpatient Hospital Stay (HOSPITAL_COMMUNITY)
Admission: AD | Admit: 2015-10-08 | Discharge: 2015-10-08 | Disposition: A | Discharge status: Home / Self Care | Payer: Medicaid Other | Source: Ambulatory Visit | Attending: Obstetrics & Gynecology | Admitting: Obstetrics & Gynecology

## 2015-10-08 ENCOUNTER — Encounter (HOSPITAL_COMMUNITY): Payer: Self-pay

## 2015-10-08 DIAGNOSIS — O26893 Other specified pregnancy related conditions, third trimester: Secondary | ICD-10-CM | POA: Insufficient documentation

## 2015-10-08 DIAGNOSIS — O9989 Other specified diseases and conditions complicating pregnancy, childbirth and the puerperium: Secondary | ICD-10-CM | POA: Diagnosis not present

## 2015-10-08 DIAGNOSIS — O26899 Other specified pregnancy related conditions, unspecified trimester: Secondary | ICD-10-CM

## 2015-10-08 DIAGNOSIS — Z87891 Personal history of nicotine dependence: Secondary | ICD-10-CM | POA: Diagnosis not present

## 2015-10-08 DIAGNOSIS — O4703 False labor before 37 completed weeks of gestation, third trimester: Secondary | ICD-10-CM | POA: Diagnosis not present

## 2015-10-08 DIAGNOSIS — R102 Pelvic and perineal pain: Secondary | ICD-10-CM | POA: Insufficient documentation

## 2015-10-08 DIAGNOSIS — N949 Unspecified condition associated with female genital organs and menstrual cycle: Secondary | ICD-10-CM | POA: Diagnosis not present

## 2015-10-08 DIAGNOSIS — N858 Other specified noninflammatory disorders of uterus: Secondary | ICD-10-CM

## 2015-10-08 DIAGNOSIS — Z3A32 32 weeks gestation of pregnancy: Secondary | ICD-10-CM | POA: Insufficient documentation

## 2015-10-08 DIAGNOSIS — R1031 Right lower quadrant pain: Secondary | ICD-10-CM | POA: Diagnosis present

## 2015-10-08 DIAGNOSIS — N859 Noninflammatory disorder of uterus, unspecified: Secondary | ICD-10-CM

## 2015-10-08 LAB — URINALYSIS, ROUTINE W REFLEX MICROSCOPIC
BILIRUBIN URINE: NEGATIVE
Glucose, UA: NEGATIVE mg/dL
HGB URINE DIPSTICK: NEGATIVE
Ketones, ur: NEGATIVE mg/dL
Leukocytes, UA: NEGATIVE
NITRITE: NEGATIVE
PROTEIN: NEGATIVE mg/dL
SPECIFIC GRAVITY, URINE: 1.02 (ref 1.005–1.030)
pH: 6.5 (ref 5.0–8.0)

## 2015-10-08 MED ORDER — ACETAMINOPHEN 500 MG PO TABS
1000.0000 mg | ORAL_TABLET | Freq: Once | ORAL | Status: AC
  Administered 2015-10-08: 1000 mg via ORAL
  Filled 2015-10-08: qty 2

## 2015-10-08 MED ORDER — NIFEDIPINE 10 MG PO CAPS
10.0000 mg | ORAL_CAPSULE | Freq: Once | ORAL | Status: AC
Start: 2015-10-08 — End: 2015-10-08
  Administered 2015-10-08: 10 mg via ORAL
  Filled 2015-10-08: qty 1

## 2015-10-08 NOTE — MAU Provider Note (Signed)
History     CSN: [id number]  Arrival date and time: 10/08/15 1514   First Provider Initiated Contact with Patient 10/08/15 1607      Chief Complaint  Patient presents with  . Abdominal Pain   HPI   Tammy Gardner is a G2P0010 @[redacted]w[redacted]d  who presents with 1 day history of right lower abdominal pain made worse by movement. It began one night ago after walking up and down the stairs. Pain has been consistent. Patient describes it as sharp and rates it an 8/10. She has not tried anything to make it better.   Denies vaginal bleeding or discharge, fever, diarrhea, constipation, decreased fetal movements, or h/a.  The patient was seen in MAU recently for abdominal pain and states this pain was different. Upon review of her records she was noted to have a negative fetal fibronectin on 6/29.   OB History    Gravida Para Term Preterm AB TAB SAB Ectopic Multiple Living   2 0 0 0 1 1 0 0 0 0       Past Medical History  Diagnosis Date  . Bacterial vaginosis   . Chlamydia   . Trichomonas   . Medical history non-contributory     Past Surgical History  Procedure Laterality Date  . Termination    . Induced abortion      Family History  Problem Relation Age of Onset  . Hypertension Mother     Social History  Substance Use Topics  . Smoking status: Former Smoker    Types: Cigarettes  . Smokeless tobacco: Never Used  . Alcohol Use: No     Comment: no use since beginning of pregnancy    Allergies: No Known Allergies  Prescriptions prior to admission  Medication Sig Dispense Refill Last Dose  . Prenatal Vit-Fe Fumarate-FA (PRENATAL MULTIVITAMIN) TABS tablet Take 1 tablet by mouth daily at 12 noon.   Past Week at Unknown time   Results for orders placed or performed during the hospital encounter of 10/08/15 (from the past 48 hour(s))  Urinalysis, Routine w reflex microscopic (not at [facility])     Status: Abnormal   Collection Time: 10/08/15  3:22 PM  Result Value Ref Range    Color, Urine YELLOW YELLOW   APPearance HAZY (A) CLEAR   Specific Gravity, Urine 1.020 1.005 - 1.030   pH 6.5 5.0 - 8.0   Glucose, UA NEGATIVE NEGATIVE mg/dL   Hgb urine dipstick NEGATIVE NEGATIVE   Bilirubin Urine NEGATIVE NEGATIVE   Ketones, ur NEGATIVE NEGATIVE mg/dL   Protein, ur NEGATIVE NEGATIVE mg/dL   Nitrite NEGATIVE NEGATIVE   Leukocytes, UA NEGATIVE NEGATIVE    Comment: MICROSCOPIC NOT DONE ON URINES WITH NEGATIVE PROTEIN, BLOOD, LEUKOCYTES, NITRITE, OR GLUCOSE <1000 mg/dL.    Review of Systems  Constitutional: Negative for fever and chills.  Gastrointestinal: Negative for nausea, vomiting and abdominal pain.  Genitourinary: Negative for dysuria and urgency.   Physical Exam   Blood pressure 130/62, pulse 95, temperature 98.5 F (36.9 C), temperature source Oral, resp. rate 18, last menstrual period 02/22/2015.  Physical Exam  Constitutional: She is oriented to person, place, and time. She appears well-developed and well-nourished. No distress.  HENT:  Head: Normocephalic.  Eyes: Pupils are equal, round, and reactive to light.  GI: Soft. She exhibits no distension. There is no tenderness. There is no rebound and no guarding.  Genitourinary:  Cervix: closed, thick, posterior   Musculoskeletal: Normal range of motion.  Neurological: She is alert and oriented  to person, place, and time.  Skin: Skin is warm. She is not diaphoretic.  Psychiatric: Her behavior is normal.   Fetal Tracing: Baseline: 135 bpm  Variability: Moderate  Accelerations: 15x15 Decelerations: quick variables  Toco: UI   MAU Course  Procedures  None  MDM  Procardia X 1 dose Tylenol 1 gram PO  Patient feeling much better.   Assessment and Plan   A:  1. Pain of round ligament affecting pregnancy, antepartum   2. Uterine irritability     P:  Discharge home in stable condition Fetal kick counts Return to MAU if symptoms worsen Increase PO fluid  Preterm labor precautions   Follow up with Dr. Clearance CootsHarper as scheduled   Duane LopeJennifer I Sohail Capraro, NP 10/08/2015 8:46 PM

## 2015-10-08 NOTE — Discharge Instructions (Signed)
Preterm Birth °Preterm birth is a birth that happens before 37 weeks of pregnancy. Most pregnancies last about 39-41 weeks. Every week in the womb is important and is beneficial to the health of the infant. Infants born before 37 weeks of pregnancy are at a higher risk for complications. Depending on when the infant was born, he or she may be: °· Late preterm. Born between 32 weeks and 37 weeks of pregnancy. °· Very preterm. Born at less than 32 weeks of pregnancy. °· Extremely preterm. Born at less than 25 weeks of pregnancy. °The earlier a baby is born, the more likely the child will have issues related to prematurity. Complications and problems that can be seen in infants born too early include: °· Problems breathing (respiratory distress syndrome). °· Low birth weight. °· Problems feeding. °· Sleeping problems. °· Yellowing of the skin (jaundice). °· Infections such as pneumonia.  °Babies born very preterm or extremely preterm are at risk for more serious medical issues. These include: °· More severe breathing issues. °· Eyesight issues. °· Brain development issues (intraventricular hemorrhage). °· Behavioral and emotional development issues. °· Growth and developmental delays. °· Cerebral palsy. °· Serious feeding or bowel complications (necrotizing enterocolitis). °CAUSES  °There are two broad categories of preterm birth. °· Spontaneous preterm birth. This is a birth resulting from preterm labor (not medically induced) or preterm premature rupture of membranes (PPROM). °· Indicated preterm birth. This is a birth resulting from labor being medically induced due to health, personal, or social reasons. °RISK FACTORS °Preterm birth may be related to certain medical conditions, lifestyle factors, or demographic factors encountered by the mother or fetus. °· Medical conditions include: °¨ Multiple gestations (twins, triplets, and so on). °¨ Infection. °¨ Diabetes. °¨ Heart disease. °¨ Kidney disease. °¨ Cervical or  uterine abnormalities. °¨ Being underweight. °¨ High blood pressure or preeclampsia. °¨ Premature rupture of membranes (PROM). °¨ Birth defects in the fetus. °· Lifestyle factors include: °¨ Poor prenatal care. °¨ Poor nutrition or anemia. °¨ Cigarette smoking. °¨ Consuming alcohol. °¨ High levels of stress and lack of social or emotional support. °¨ Exposure to chemical or environmental toxins. °¨ Substance abuse. °· Demographic factors include: °¨ African-American ethnicity. °¨ Age (younger than 18 or older than 24 years of age). °¨ Low socioeconomic status. °Women with a history of preterm labor or who become pregnant within 18 months of giving birth are also at increased risk for preterm birth. °DIAGNOSIS  °Your health care provider may request additional tests to diagnose underlying complications resulting from preterm birth. Tests on the infant may include: °· Physical exam. °· Blood tests. °· Chest X-rays. °· Heart-lung monitoring. °TREATMENT  °After birth, special care will be taken to assess any problems or complications for the infant. Supportive care will be provided for the infant. Treatment depends on what problems are present and any complications that develop. Some preterm infants are cared for in a neonatal intensive care unit. In general, care may include: °· Maintaining temperature and oxygen in a clear heated box (baby isolette). °· Monitoring the infant's heart rate, breathing, and level of oxygen in the blood. °· Monitoring for signs of infection and, if needed, giving IV antibiotic medicine. °· Inserting a feeding tube (nose, mouth) or giving IV nutrition if unable to feed. °· Inserting a breathing tube (ventilation). °· Respiration support (continuous positive airway pressure [CPAP] or oxygen).  °Treatment will change as the infant builds up strength and is able to breathe and eat on his or her   own. For some infants, no special treatment is necessary. Parents may be educated on the potential  health risks of prematurity to the infant. HOME CARE INSTRUCTIONS  Understand your infant's special conditions and needs. It may be reassuring to learn about infant CPR.  Monitor your infant in the car seat until he or she grows and matures. Infant car seats can cause breathing difficulties for preterm infants.  Keep your infant warm. Dress your infant in layers and keep him or her away from drafts, especially in cold months of the year.  Wash your hands thoroughly after going to the bathroom or changing a diaper. Late preterm infants may be more prone to infection.  Follow all your health care provider's instructions for providing support and care to your preterm infant.  Get support from organizations and groups that understand your challenges.  Follow up with your infant's health care provider as directed. Prevention There are some things you can do to help lower your risk of having a preterm infant in the future. These include:  Good prenatal care throughout the entire pregnancy. See a health care provider regularly for advice and tests.  Management of underlying medical conditions.  Proper self-care and lifestyle changes.  Proper diet and weight control.  Watching for signs of various infections. SEEK MEDICAL CARE IF:  Your infant has feeding difficulties.  Your infant has sleeping difficulties.  Your infant has breathing difficulties.  Your infant's skin starts to look yellow.  Your infant shows signs of infection, such as a stuffy nose, fever, crying, or bluish color of the skin. FOR MORE INFORMATION [employer]: www.marchofdimes.com Prematurity.org: www.prematurity.org   This information is not intended to replace advice given to you by your health care provider. Make sure you discuss any questions you have with your health care provider.   Document Released: 06/12/2003 Document Revised: 01/10/2013 Document Reviewed: 10/19/2012 Elsevier Interactive Patient  Education 2016 Elsevier Inc.   Round Ligament Pain During Pregnancy   Round ligament pain is a sharp pain or jabbing feeling often felt in the lower belly or groin area on one or both sides. It is one of the most common complaints during pregnancy and is considered a normal part of pregnancy. It is most often felt during the second trimester.   Here is what you need to know about round ligament pain, including some tips to help you feel better.   Causes of Round Ligament Pain:    Several thick ligaments surround and support your womb (uterus) as it grows during pregnancy. One of them is called the round ligament.   The round ligament connects the front part of the womb to your groin, the area where your legs attach to your pelvis. The round ligament normally tightens and relaxes slowly.   As your baby and womb grow, the round ligament stretches. That makes it more likely to become strained.   Sudden movements can cause the ligament to tighten quickly, like a rubber band snapping. This causes a sudden and quick jabbing feeling.   Symptoms of Round Ligament Pain   Round ligament pain can be concerning and uncomfortable. But it is considered normal as your body changes during pregnancy.   The symptoms of round ligament pain include a sharp, sudden spasm in the belly. It usually affects the right side, but it may happen on both sides. The pain only lasts a few seconds.   Exercise may cause the pain, as will rapid movements such as:   sneezing  coughing  laughing  rolling over in bed  standing up too quickly   Treatment of Round Ligament Pain   Here are some tips that may help reduce your discomfort:   Pain relief. Take over-the-counter acetaminophen for pain, if necessary. Ask your doctor if this is OK.   Exercise. Get plenty of exercise to keep your stomach (core) muscles strong. Doing stretching exercises or prenatal yoga can be helpful. Ask your doctor which exercises are safe  for you and your baby.   A helpful exercise involves putting your hands and knees on the floor, lowering your head, and pushing your backside into the air.   Avoid sudden movements. Change positions slowly (such as standing up or sitting down) to avoid sudden movements that may cause stretching and pain.   Flex your hips. Bend and flex your hips before you cough, sneeze, or laugh to avoid pulling on the ligaments.   Apply warmth. A heating pad or warm bath may be helpful. Ask your doctor if this is OK. Extreme heat can be dangerous to the baby.   You should try to modify your daily activity level and avoid positions that may worsen the condition.   When to Call the Doctor/Midwife   Always tell your doctor or midwife about any type of pain you have during pregnancy. Round ligament pain is quick and doesn't last long.   Call your health care provider immediately if you have:   severe pain  fever  chills  pain on urination  difficulty walking   Belly pain during pregnancy can be due to many different causes. It is important for your doctor to rule out more serious conditions, including pregnancy complications such as placenta abruption or non-pregnancy illnesses such as:   inguinal hernia  appendicitis  stomach, liver, and kidney problems  Preterm labor pains may sometimes be mistaken for round ligament pain.

## 2015-10-08 NOTE — MAU Note (Signed)
Patient presents with c/o of lower abdominal pain that started last night. Patient states that this is a constant pain and is different from the ctx pain she had last week. Patient states that pain is worse with activity. Patient denies any bleeding or LOF.

## 2015-10-09 ENCOUNTER — Encounter: Payer: Medicaid Other | Admitting: Obstetrics

## 2015-10-09 ENCOUNTER — Inpatient Hospital Stay (HOSPITAL_COMMUNITY)
Admission: AD | Admit: 2015-10-09 | Discharge: 2015-10-09 | Disposition: A | Discharge status: Home / Self Care | Payer: Medicaid Other | Source: Ambulatory Visit | Attending: Obstetrics and Gynecology | Admitting: Obstetrics and Gynecology

## 2015-10-09 ENCOUNTER — Encounter (HOSPITAL_COMMUNITY): Payer: Self-pay

## 2015-10-09 DIAGNOSIS — O4703 False labor before 37 completed weeks of gestation, third trimester: Secondary | ICD-10-CM

## 2015-10-09 DIAGNOSIS — Z87891 Personal history of nicotine dependence: Secondary | ICD-10-CM | POA: Diagnosis not present

## 2015-10-09 DIAGNOSIS — Z3A32 32 weeks gestation of pregnancy: Secondary | ICD-10-CM | POA: Diagnosis not present

## 2015-10-09 DIAGNOSIS — R1031 Right lower quadrant pain: Secondary | ICD-10-CM | POA: Insufficient documentation

## 2015-10-09 DIAGNOSIS — Z711 Person with feared health complaint in whom no diagnosis is made: Secondary | ICD-10-CM

## 2015-10-09 DIAGNOSIS — O26893 Other specified pregnancy related conditions, third trimester: Secondary | ICD-10-CM | POA: Insufficient documentation

## 2015-10-09 DIAGNOSIS — N859 Noninflammatory disorder of uterus, unspecified: Secondary | ICD-10-CM

## 2015-10-09 DIAGNOSIS — N858 Other specified noninflammatory disorders of uterus: Secondary | ICD-10-CM

## 2015-10-09 LAB — URINALYSIS, ROUTINE W REFLEX MICROSCOPIC
Bilirubin Urine: NEGATIVE
Glucose, UA: NEGATIVE mg/dL
HGB URINE DIPSTICK: NEGATIVE
KETONES UR: NEGATIVE mg/dL
LEUKOCYTES UA: NEGATIVE
Nitrite: NEGATIVE
PROTEIN: NEGATIVE mg/dL
Specific Gravity, Urine: 1.01 (ref 1.005–1.030)
pH: 7 (ref 5.0–8.0)

## 2015-10-09 LAB — CBC
HEMATOCRIT: 37.2 % (ref 36.0–46.0)
HEMOGLOBIN: 13.1 g/dL (ref 12.0–15.0)
MCH: 29.3 pg (ref 26.0–34.0)
MCHC: 35.2 g/dL (ref 30.0–36.0)
MCV: 83.2 fL (ref 78.0–100.0)
Platelets: 226 10*3/uL (ref 150–400)
RBC: 4.47 MIL/uL (ref 3.87–5.11)
RDW: 13.5 % (ref 11.5–15.5)
WBC: 11 10*3/uL — AB (ref 4.0–10.5)

## 2015-10-09 MED ORDER — NIFEDIPINE 10 MG PO CAPS: 10.0000 mg | ORAL_CAPSULE | Freq: Three times a day (TID) | ORAL | Status: DC | PRN

## 2015-10-09 MED ORDER — NIFEDIPINE 10 MG PO CAPS
10.0000 mg | ORAL_CAPSULE | Freq: Once | ORAL | Status: AC
  Administered 2015-10-09: 10 mg via ORAL
  Filled 2015-10-09: qty 1

## 2015-10-09 NOTE — MAU Note (Signed)
Pt stated she is still having ctx today. Every few min she is having pains. Was evaluated yesterday for the same thing.

## 2015-10-09 NOTE — MAU Provider Note (Signed)
History     CSN: [id number]  Arrival date and time: 10/09/15 [id number]   First Provider Initiated Contact with Patient 10/09/15 1014      Chief Complaint  Patient presents with  . Contractions   HPI  [name] is a 25 y.o. female is a 25 y.o. G2P0010 @[redacted]w[redacted]d  who was seen in the MAU yesterday for RLQ abdominal pain and diagnosed with round ligament pain, presenting today with persistence and worsening of RLQ pain along with occasional contractions. While speaking with the patient, she remained on her phone trying to get cable television set up at her house, she did this same thing while here yesterday in MAU.  She states the pain is waking her up at night and she is having trouble falling asleep due to the pain.  She has tried Tylenol with no relief. The pain is worse when sitting and somewhat relieved by laying down. She works at a call center and sits all day and wants to make sure it's okay for her to sit all day because "it puts extra pressure down there and is almost like the baby is in my lap."  Yesterday, she was told to buy a maternity belt or girdle to relieve the pain but has not done so due to financial difficulties. She feels that the procardia has helped her pain and is ok with trying that again today and possibly going home with an Rx for home use.   Denies vaginal bleeding/discharge, decreased fetal movements, n/v, fever. Negative fetal fibronectin 10/02/15.     Patient denies pain while here in MAU currently. She feels like upon arrival the pain stops.    OB History    Gravida Para Term Preterm AB TAB SAB Ectopic Multiple Living   2 0 0 0 1 1 0 0 0 0       Past Medical History  Diagnosis Date  . Bacterial vaginosis   . Chlamydia   . Trichomonas   . Medical history non-contributory     Past Surgical History  Procedure Laterality Date  . Termination    . Induced abortion      Family History  Problem Relation Age of Onset  . Hypertension Mother     Social  History  Substance Use Topics  . Smoking status: Former Smoker    Types: Cigarettes  . Smokeless tobacco: Never Used  . Alcohol Use: No     Comment: no use since beginning of pregnancy    Allergies: No Known Allergies  Prescriptions prior to admission  Medication Sig Dispense Refill Last Dose  . Prenatal Vit-Fe Fumarate-FA (PRENATAL MULTIVITAMIN) TABS tablet Take 1 tablet by mouth daily at 12 noon.   10/08/2015 at Unknown time   Results for orders placed or performed during the hospital encounter of 10/09/15 (from the past 48 hour(s))  Urinalysis, Routine w reflex microscopic (not at [facility])     Status: None   Collection Time: 10/09/15  9:28 AM  Result Value Ref Range   Color, Urine YELLOW YELLOW   APPearance CLEAR CLEAR   Specific Gravity, Urine 1.010 1.005 - 1.030   pH 7.0 5.0 - 8.0   Glucose, UA NEGATIVE NEGATIVE mg/dL   Hgb urine dipstick NEGATIVE NEGATIVE   Bilirubin Urine NEGATIVE NEGATIVE   Ketones, ur NEGATIVE NEGATIVE mg/dL   Protein, ur NEGATIVE NEGATIVE mg/dL   Nitrite NEGATIVE NEGATIVE   Leukocytes, UA NEGATIVE NEGATIVE    Comment: MICROSCOPIC NOT DONE ON URINES WITH NEGATIVE PROTEIN, BLOOD, LEUKOCYTES,  NITRITE, OR GLUCOSE <1000 mg/dL.  CBC     Status: Abnormal   Collection Time: 10/09/15 11:36 AM  Result Value Ref Range   WBC 11.0 (H) 4.0 - 10.5 K/uL   RBC 4.47 3.87 - 5.11 MIL/uL   Hemoglobin 13.1 12.0 - 15.0 g/dL   HCT 40.937.2 81.136.0 - 91.446.0 %   MCV 83.2 78.0 - 100.0 fL   MCH 29.3 26.0 - 34.0 pg   MCHC 35.2 30.0 - 36.0 g/dL   RDW 78.213.5 95.611.5 - 21.315.5 %   Platelets 226 150 - 400 K/uL    Review of Systems  Constitutional: Negative for fever and chills.  Gastrointestinal: Negative for nausea, vomiting, abdominal pain (None currently ) and constipation.   Physical Exam   Physical Exam  Constitutional: She is oriented to person, place, and time and well-developed, well-nourished, and in no distress. She appears not lethargic, to not be writhing in pain and not  dehydrated. She appears healthy.  Non-toxic appearance. She does not have a sickly appearance. No distress.  HENT:  Head: Normocephalic.  Genitourinary:  Cervix closed, thick, posterior   Neurological: She is oriented to person, place, and time. She appears not lethargic.  Skin: Skin is warm. She is not diaphoretic.  Psychiatric: Judgment normal.    Blood pressure 120/71, pulse 76, temperature 98.1 F (36.7 C), temperature source Oral, resp. rate 18, height 5' (1.524 m), weight 144 lb 1.9 oz (65.372 kg), last menstrual period 02/22/2015.  Physical Exam  Constitutional: She is oriented to person, place, and time and well-developed, well-nourished, and in no distress. She appears not lethargic, to not be writhing in pain and not dehydrated. She appears healthy.  Non-toxic appearance. She does not have a sickly appearance. No distress.  HENT:  Head: Normocephalic.  Genitourinary:  Cervix closed, thick, posterior   Neurological: She is oriented to person, place, and time. She appears not lethargic.  Skin: Skin is warm. She is not diaphoretic.  Psychiatric: Judgment normal.   Fetal Tracing: Baseline: 120 bpm  Variability: moderate  Accelerations: 15x15 Decelerations: None  Toco: quiet   MAU Course  Procedures  MDM  NST Procardia x1 dose  CBC  Patient feeling much better, she rates her pain 4/10.  Assessment and Plan   A:  1. Physically well but worried   2. Uterine irritability     P:  Discharge home in stable condition Preterm labor precautions Rx: Procardia PRN  Follow up with Dr. Clearance CootsHarper this week Return to MAU if symptoms worsen Fetal kick counts Patient requesting work note again.   Duane LopeJennifer I Rasch, NP 10/09/2015 2:04 PM

## 2015-10-09 NOTE — Discharge Instructions (Signed)
Braxton Hicks Contractions °Contractions of the uterus can occur throughout pregnancy. Contractions are not always a sign that you are in labor.  °WHAT ARE BRAXTON HICKS CONTRACTIONS?  °Contractions that occur before labor are called Braxton Hicks contractions, or false labor. Toward the end of pregnancy (32-34 weeks), these contractions can develop more often and may become more forceful. This is not true labor because these contractions do not result in opening (dilatation) and thinning of the cervix. They are sometimes difficult to tell apart from true labor because these contractions can be forceful and people have different pain tolerances. You should not feel embarrassed if you go to the hospital with false labor. Sometimes, the only way to tell if you are in true labor is for your health care provider to look for changes in the cervix. °If there are no prenatal problems or other health problems associated with the pregnancy, it is completely safe to be sent home with false labor and await the onset of true labor. °HOW CAN YOU TELL THE DIFFERENCE BETWEEN TRUE AND FALSE LABOR? °False Labor °· The contractions of false labor are usually shorter and not as hard as those of true labor.   °· The contractions are usually irregular.   °· The contractions are often felt in the front of the lower abdomen and in the groin.   °· The contractions may go away when you walk around or change positions while lying down.   °· The contractions get weaker and are shorter lasting as time goes on.   °· The contractions do not usually become progressively stronger, regular, and closer together as with true labor.   °True Labor °· Contractions in true labor last 30-70 seconds, become very regular, usually become more intense, and increase in frequency.   °· The contractions do not go away with walking.   °· The discomfort is usually felt in the top of the uterus and spreads to the lower abdomen and low back.   °· True labor can be  determined by your health care provider with an exam. This will show that the cervix is dilating and getting thinner.   °WHAT TO REMEMBER °· Keep up with your usual exercises and follow other instructions given by your health care provider.   °· Take medicines as directed by your health care provider.   °· Keep your regular prenatal appointments.   °· Eat and drink lightly if you think you are going into labor.   °· If Braxton Hicks contractions are making you uncomfortable:   °¨ Change your position from lying down or resting to walking, or from walking to resting.   °¨ Sit and rest in a tub of warm water.   °¨ Drink 2-3 glasses of water. Dehydration may cause these contractions.   °¨ Do slow and deep breathing several times an hour.   °WHEN SHOULD I SEEK IMMEDIATE MEDICAL CARE? °Seek immediate medical care if: °· Your contractions become stronger, more regular, and closer together.   °· You have fluid leaking or gushing from your vagina.   °· You have a fever.   °· You pass blood-tinged mucus.   °· You have vaginal bleeding.   °· You have continuous abdominal pain.   °· You have low back pain that you never had before.   °· You feel your baby's head pushing down and causing pelvic pressure.   °· Your baby is not moving as much as it used to.   °  °This information is not intended to replace advice given to you by your health care provider. Make sure you discuss any questions you have with your health care   provider. °  °Document Released: 03/22/2005 Document Revised: 03/27/2013 Document Reviewed: 01/01/2013 °Elsevier Interactive Patient Education ©2016 [employer] ° °

## 2015-10-14 ENCOUNTER — Encounter: Payer: Medicaid Other | Admitting: Obstetrics

## 2015-10-23 ENCOUNTER — Ambulatory Visit (INDEPENDENT_AMBULATORY_CARE_PROVIDER_SITE_OTHER): Payer: Medicaid Other | Admitting: Obstetrics

## 2015-10-23 ENCOUNTER — Encounter: Payer: Self-pay | Admitting: Obstetrics

## 2015-10-23 VITALS — BP 135/86 | HR 75 | Temp 97.9°F | Wt 147.0 lb

## 2015-10-23 DIAGNOSIS — Z3403 Encounter for supervision of normal first pregnancy, third trimester: Secondary | ICD-10-CM

## 2015-10-23 LAB — POCT URINALYSIS DIPSTICK
BILIRUBIN UA: NEGATIVE
GLUCOSE UA: NEGATIVE
KETONES UA: NEGATIVE
Leukocytes, UA: NEGATIVE
NITRITE UA: NEGATIVE
Protein, UA: NEGATIVE
RBC UA: NEGATIVE
Urobilinogen, UA: NEGATIVE
pH, UA: 8

## 2015-10-23 NOTE — Progress Notes (Signed)
Subjective:    Tammy Gardner is a 25 y.o. female being seen today for her obstetrical visit. She is at 5965w5d gestation. Patient reports no complaints. Fetal movement: normal.  Problem List Items Addressed This Visit    None    Visit Diagnoses    Encounter for supervision of normal first pregnancy in third trimester    -  Primary    Relevant Orders    POCT urinalysis dipstick (Completed)      There are no active problems to display for this patient.  Objective:    BP 135/86 mmHg  Pulse 75  Temp(Src) 97.9 F (36.6 C)  Wt 147 lb (66.679 kg)  LMP 02/22/2015 FHT:  150 BPM  Uterine Size: size equals dates  Presentation: unsure     Assessment:    Pregnancy @ 4365w5d weeks   Plan:     labs reviewed, problem list updated Consent signed. GBS sent TDAP offered  Rhogam given for RH negative Pediatrician: discussed. Infant feeding: plans to breastfeed. Maternity leave: discussed. Cigarette smoking: former smoker. Orders Placed This Encounter  Procedures  . POCT urinalysis dipstick   No orders of the defined types were placed in this encounter.   Follow up in 1 Week.

## 2015-10-23 NOTE — Progress Notes (Signed)
Patient feels some pressure- but no contractions. She states she didn't feel them when she was having them previous.

## 2015-11-03 ENCOUNTER — Ambulatory Visit (INDEPENDENT_AMBULATORY_CARE_PROVIDER_SITE_OTHER): Payer: Medicaid Other | Admitting: Obstetrics

## 2015-11-03 ENCOUNTER — Encounter: Payer: Self-pay | Admitting: Obstetrics

## 2015-11-03 VITALS — BP 130/78 | HR 78 | Temp 98.3°F | Wt 149.0 lb

## 2015-11-03 DIAGNOSIS — Z3403 Encounter for supervision of normal first pregnancy, third trimester: Secondary | ICD-10-CM

## 2015-11-03 NOTE — Progress Notes (Signed)
Patient is experiencing soreness across lower abd. Subjective:    Tammy Gardner is a 25 y.o. female being seen today for her obstetrical visit. She is at [redacted]w[redacted]d gestation. Patient reports occasional contractions. Fetal movement: normal.  Problem List Items Addressed This Visit    None    Visit Diagnoses   None.    There are no active problems to display for this patient.  Objective:    BP 130/78   Pulse 78   Temp 98.3 F (36.8 C)   Wt 149 lb (67.6 kg)   LMP 02/22/2015   BMI 29.10 kg/m  FHT:  150 BPM  Uterine Size: size equals dates  Presentation: unsure     Assessment:    Pregnancy @ [redacted]w[redacted]d weeks   Plan:     labs reviewed, problem list updated Consent signed. GBS sent TDAP offered  Rhogam given for RH negative Pediatrician: discussed. Infant feeding: plans to breastfeed. Maternity leave: discussed.  No orders of the defined types were placed in this encounter.  No orders of the defined types were placed in this encounter.  Follow up in 1 Week.

## 2015-11-12 ENCOUNTER — Encounter: Payer: Self-pay | Admitting: Obstetrics

## 2015-11-12 ENCOUNTER — Ambulatory Visit (INDEPENDENT_AMBULATORY_CARE_PROVIDER_SITE_OTHER): Payer: Medicaid Other | Admitting: Obstetrics

## 2015-11-12 VITALS — BP 132/84 | HR 75 | Wt 152.0 lb

## 2015-11-12 DIAGNOSIS — Z3403 Encounter for supervision of normal first pregnancy, third trimester: Secondary | ICD-10-CM

## 2015-11-12 LAB — POCT URINALYSIS DIPSTICK
Bilirubin, UA: NEGATIVE
Blood, UA: NEGATIVE
Glucose, UA: NEGATIVE
Ketones, UA: NEGATIVE
Leukocytes, UA: NEGATIVE
Nitrite, UA: NEGATIVE
Protein, UA: NEGATIVE
Spec Grav, UA: 1.015
Urobilinogen, UA: NEGATIVE
pH, UA: 6

## 2015-11-12 NOTE — Progress Notes (Signed)
Patient ID: Tammy Gardner, female   DOB: 11-15-1990, 25 y.o.   MRN: [id number] Subjective:    Tammy Gardner is a 25 y.o. female being seen today for her obstetrical visit. She is at 261w4d gestation. Patient reports no complaints. Fetal movement: normal.  Problem List Items Addressed This Visit    None    Visit Diagnoses    Encounter for supervision of normal first pregnancy in third trimester    -  Primary   Relevant Orders   POCT urinalysis dipstick     There are no active problems to display for this patient.   Objective:    BP 132/84   Pulse 75   Wt 152 lb (68.9 kg)   LMP 02/22/2015   BMI 29.69 kg/m  FHT: 150 BPM  Uterine Size: size equals dates  Presentations: cephalic  Pelvic Exam:              Dilation: Closed       Effacement: 50%             Station:  -2    Consistency: medium            Position: posterior     Assessment:    Pregnancy @ 3861w4d weeks   Plan:   Plans for delivery: Vaginal anticipated; labs reviewed; problem list updated Counseling: Consent signed. Infant feeding: plans to breastfeed.  L&D discussion: symptoms of labor, discussed when to call, discussed what number to call, anesthetic/analgesic options reviewed and delivering clinician:  plans no preference. Postpartum supports and preparation: circumcision discussed and contraception plans discussed.  Follow up in 1 Week.

## 2015-11-19 ENCOUNTER — Encounter: Payer: Self-pay | Admitting: Obstetrics

## 2015-11-19 ENCOUNTER — Ambulatory Visit (INDEPENDENT_AMBULATORY_CARE_PROVIDER_SITE_OTHER): Payer: Medicaid Other | Admitting: Obstetrics

## 2015-11-19 VITALS — BP 138/81 | HR 80 | Temp 98.7°F | Wt 153.2 lb

## 2015-11-19 DIAGNOSIS — Z3493 Encounter for supervision of normal pregnancy, unspecified, third trimester: Secondary | ICD-10-CM

## 2015-11-19 DIAGNOSIS — Z3403 Encounter for supervision of normal first pregnancy, third trimester: Secondary | ICD-10-CM

## 2015-11-19 NOTE — Progress Notes (Signed)
Patient has noticed her baby doesn't move as much as she used to.

## 2015-11-19 NOTE — Progress Notes (Signed)
Patient ID: Tammy Gardner, female   DOB: 04-Nov-1990, 25 y.o.   MRN: [id number] Subjective:    Tammy Gardner is a 25 y.o. female being seen today for her obstetrical visit. She is at 740w4d gestation. Patient reports no complaints. Fetal movement: Decreased  Problem List Items Addressed This Visit    None    Visit Diagnoses    Encounter for supervision of normal first pregnancy in third trimester    -  Primary   Relevant Orders   POCT urinalysis dipstick     There are no active problems to display for this patient.   Objective:    BP 138/81   Pulse 80   Temp 98.7 F (37.1 C)   Wt 153 lb 3.2 oz (69.5 kg)   LMP 02/22/2015   BMI 29.92 kg/m  FHT: 150 BPM  Uterine Size: size equals dates  Presentations: cephalic   NST:  Reactive  Assessment:    Pregnancy @ 5640w4d weeks    Decreased FM.  Reactive NST   Plan:   Plans for delivery: Vaginal anticipated; labs reviewed; problem list updated Counseling: Consent signed. Infant feeding: plans to breastfeed. Cigarette smoking: former smoker L&D discussion: symptoms of labor, discussed when to call, discussed what number to call, anesthetic/analgesic options reviewed and delivering clinician:  plans no preference. Postpartum supports and preparation: circumcision discussed and contraception plans discussed.  Follow up in 1 Week.

## 2015-11-26 ENCOUNTER — Ambulatory Visit (INDEPENDENT_AMBULATORY_CARE_PROVIDER_SITE_OTHER): Payer: Medicaid Other | Admitting: Certified Nurse Midwife

## 2015-11-26 VITALS — BP 135/86 | HR 74 | Wt 156.0 lb

## 2015-11-26 DIAGNOSIS — Z3403 Encounter for supervision of normal first pregnancy, third trimester: Secondary | ICD-10-CM

## 2015-11-26 DIAGNOSIS — Z3483 Encounter for supervision of other normal pregnancy, third trimester: Secondary | ICD-10-CM | POA: Insufficient documentation

## 2015-11-26 LAB — POCT URINALYSIS DIPSTICK
BILIRUBIN UA: NEGATIVE
GLUCOSE UA: NEGATIVE
KETONES UA: NEGATIVE
Leukocytes, UA: NEGATIVE
NITRITE UA: NEGATIVE
PH UA: 7
Protein, UA: NEGATIVE
RBC UA: NEGATIVE
SPEC GRAV UA: 1.015
Urobilinogen, UA: NEGATIVE

## 2015-11-26 NOTE — Progress Notes (Signed)
Subjective:    Tammy Gardner is a 25 y.o. female being seen today for her obstetrical visit. She is at 6273w4d gestation. Patient reports no complaints. Fetal movement: normal.  Problem List Items Addressed This Visit      Other   Encounter for supervision of other normal pregnancy in third trimester   Relevant Orders   US OB Follow Up    Other Visit Diagnoses    Encounter for supervision of normal first pregnancy in third trimester    -  Primary   Relevant Orders   POCT urinalysis dipstick (Completed)     Patient Active Problem List   Diagnosis Date Noted  . Encounter for supervision of other normal pregnancy in third trimester 11/26/2015    Objective:    BP 135/86   Pulse 74   Wt 156 lb (70.8 kg)   LMP 02/22/2015   BMI 30.47 kg/m  FHT: 125 BPM  Uterine Size: 35 cm and size equals dates  Presentations: cephalic  Pelvic Exam:              Dilation: 1cm       Effacement: Long             Station:  -3    Consistency: medium            Position: middle     Assessment:    Pregnancy @ 5873w4d weeks   Plan:   Plans for delivery: Vaginal anticipated; labs reviewed; problem list updated Counseling: Consent signed. Infant feeding: plans to breastfeed. Cigarette smoking: never smoked. L&D discussion: symptoms of labor, discussed when to call, discussed what number to call, anesthetic/analgesic options reviewed and delivering clinician:  plans no preference. Postpartum supports and preparation: circumcision discussed and contraception plans discussed.  Follow up in 1 Week with NST.

## 2015-11-28 ENCOUNTER — Inpatient Hospital Stay (HOSPITAL_COMMUNITY)
Admission: AD | Admit: 2015-11-28 | Discharge: 2015-11-28 | Disposition: A | Discharge status: Home / Self Care | Payer: Medicaid Other | Source: Ambulatory Visit | Attending: Obstetrics & Gynecology | Admitting: Obstetrics & Gynecology

## 2015-11-28 ENCOUNTER — Encounter (HOSPITAL_COMMUNITY): Payer: Self-pay | Admitting: *Deleted

## 2015-11-28 DIAGNOSIS — Z3493 Encounter for supervision of normal pregnancy, unspecified, third trimester: Secondary | ICD-10-CM

## 2015-11-28 DIAGNOSIS — Z3A40 40 weeks gestation of pregnancy: Secondary | ICD-10-CM

## 2015-11-28 NOTE — Progress Notes (Signed)
Dr Artist PaisYoo notified of pt's VE, FHR pattern orders received to discharge home

## 2015-11-28 NOTE — Discharge Instructions (Signed)
Fetal Movement Counts  Patient Name: __________________________________________________ Patient Due Date: ____________________  Performing a fetal movement count is highly recommended in high-risk pregnancies, but it is good for every pregnant woman to do. Your health care provider may ask you to start counting fetal movements at 28 weeks of the pregnancy. Fetal movements often increase:  · After eating a full meal.  · After physical activity.  · After eating or drinking something sweet or cold.  · At rest.  Pay attention to when you feel the baby is most active. This will help you notice a pattern of your baby's sleep and wake cycles and what factors contribute to an increase in fetal movement. It is important to perform a fetal movement count at the same time each day when your baby is normally most active.   HOW TO COUNT FETAL MOVEMENTS  1. Find a quiet and comfortable area to sit or lie down on your left side. Lying on your left side provides the best blood and oxygen circulation to your baby.  2. Write down the day and time on a sheet of paper or in a journal.  3. Start counting kicks, flutters, swishes, rolls, or jabs in a 2-hour period. You should feel at least 10 movements within 2 hours.  4. If you do not feel 10 movements in 2 hours, wait 2-3 hours and count again. Look for a change in the pattern or not enough counts in 2 hours.  SEEK MEDICAL CARE IF:  · You feel less than 10 counts in 2 hours, tried twice.  · There is no movement in over an hour.  · The pattern is changing or taking longer each day to reach 10 counts in 2 hours.  · You feel the baby is not moving as he or she usually does.  Date: ____________ Movements: ____________ Start time: ____________ Finish time: ____________   Date: ____________ Movements: ____________ Start time: ____________ Finish time: ____________  Date: ____________ Movements: ____________ Start time: ____________ Finish time: ____________  Date: ____________ Movements:  ____________ Start time: ____________ Finish time: ____________  Date: ____________ Movements: ____________ Start time: ____________ Finish time: ____________  Date: ____________ Movements: ____________ Start time: ____________ Finish time: ____________  Date: ____________ Movements: ____________ Start time: ____________ Finish time: ____________  Date: ____________ Movements: ____________ Start time: ____________ Finish time: ____________   Date: ____________ Movements: ____________ Start time: ____________ Finish time: ____________  Date: ____________ Movements: ____________ Start time: ____________ Finish time: ____________  Date: ____________ Movements: ____________ Start time: ____________ Finish time: ____________  Date: ____________ Movements: ____________ Start time: ____________ Finish time: ____________  Date: ____________ Movements: ____________ Start time: ____________ Finish time: ____________  Date: ____________ Movements: ____________ Start time: ____________ Finish time: ____________  Date: ____________ Movements: ____________ Start time: ____________ Finish time: ____________   Date: ____________ Movements: ____________ Start time: ____________ Finish time: ____________  Date: ____________ Movements: ____________ Start time: ____________ Finish time: ____________  Date: ____________ Movements: ____________ Start time: ____________ Finish time: ____________  Date: ____________ Movements: ____________ Start time: ____________ Finish time: ____________  Date: ____________ Movements: ____________ Start time: ____________ Finish time: ____________  Date: ____________ Movements: ____________ Start time: ____________ Finish time: ____________  Date: ____________ Movements: ____________ Start time: ____________ Finish time: ____________   Date: ____________ Movements: ____________ Start time: ____________ Finish time: ____________  Date: ____________ Movements: ____________ Start time: ____________ Finish  time: ____________  Date: ____________ Movements: ____________ Start time: ____________ Finish time: ____________  Date: ____________ Movements: ____________ Start time:   ____________ Finish time: ____________  Date: ____________ Movements: ____________ Start time: ____________ Finish time: ____________  Date: ____________ Movements: ____________ Start time: ____________ Finish time: ____________  Date: ____________ Movements: ____________ Start time: ____________ Finish time: ____________   Date: ____________ Movements: ____________ Start time: ____________ Finish time: ____________  Date: ____________ Movements: ____________ Start time: ____________ Finish time: ____________  Date: ____________ Movements: ____________ Start time: ____________ Finish time: ____________  Date: ____________ Movements: ____________ Start time: ____________ Finish time: ____________  Date: ____________ Movements: ____________ Start time: ____________ Finish time: ____________  Date: ____________ Movements: ____________ Start time: ____________ Finish time: ____________  Date: ____________ Movements: ____________ Start time: ____________ Finish time: ____________   Date: ____________ Movements: ____________ Start time: ____________ Finish time: ____________  Date: ____________ Movements: ____________ Start time: ____________ Finish time: ____________  Date: ____________ Movements: ____________ Start time: ____________ Finish time: ____________  Date: ____________ Movements: ____________ Start time: ____________ Finish time: ____________  Date: ____________ Movements: ____________ Start time: ____________ Finish time: ____________  Date: ____________ Movements: ____________ Start time: ____________ Finish time: ____________  Date: ____________ Movements: ____________ Start time: ____________ Finish time: ____________   Date: ____________ Movements: ____________ Start time: ____________ Finish time: ____________  Date: ____________  Movements: ____________ Start time: ____________ Finish time: ____________  Date: ____________ Movements: ____________ Start time: ____________ Finish time: ____________  Date: ____________ Movements: ____________ Start time: ____________ Finish time: ____________  Date: ____________ Movements: ____________ Start time: ____________ Finish time: ____________  Date: ____________ Movements: ____________ Start time: ____________ Finish time: ____________  Date: ____________ Movements: ____________ Start time: ____________ Finish time: ____________   Date: ____________ Movements: ____________ Start time: ____________ Finish time: ____________  Date: ____________ Movements: ____________ Start time: ____________ Finish time: ____________  Date: ____________ Movements: ____________ Start time: ____________ Finish time: ____________  Date: ____________ Movements: ____________ Start time: ____________ Finish time: ____________  Date: ____________ Movements: ____________ Start time: ____________ Finish time: ____________  Date: ____________ Movements: ____________ Start time: ____________ Finish time: ____________     This information is not intended to replace advice given to you by your health care provider. Make sure you discuss any questions you have with your health care provider.     Document Released: 04/21/2006 Document Revised: 04/12/2014 Document Reviewed: 01/17/2012  Elsevier Interactive Patient Education ©2016 [employer] Inc[employer]

## 2015-11-28 NOTE — [facility]U Note (Signed)
PT presents to [facility]U with complaints of contractions that started two days ago. Denies any vaginal bleeding or LOF

## 2015-11-29 ENCOUNTER — Inpatient Hospital Stay (HOSPITAL_COMMUNITY): Payer: Medicaid Other | Admitting: Anesthesiology

## 2015-11-29 ENCOUNTER — Encounter (HOSPITAL_COMMUNITY): Payer: Self-pay | Admitting: Certified Nurse Midwife

## 2015-11-29 ENCOUNTER — Inpatient Hospital Stay (HOSPITAL_COMMUNITY)
Admission: AD | Admit: 2015-11-29 | Discharge: 2015-12-01 | DRG: 775 | Disposition: A | Discharge status: Home / Self Care | Payer: Medicaid Other | Source: Ambulatory Visit | Attending: Obstetrics & Gynecology | Admitting: Obstetrics & Gynecology

## 2015-11-29 DIAGNOSIS — Z3A40 40 weeks gestation of pregnancy: Secondary | ICD-10-CM | POA: Diagnosis not present

## 2015-11-29 DIAGNOSIS — Z8249 Family history of ischemic heart disease and other diseases of the circulatory system: Secondary | ICD-10-CM

## 2015-11-29 DIAGNOSIS — Z87891 Personal history of nicotine dependence: Secondary | ICD-10-CM | POA: Diagnosis not present

## 2015-11-29 DIAGNOSIS — O99824 Streptococcus B carrier state complicating childbirth: Secondary | ICD-10-CM | POA: Diagnosis present

## 2015-11-29 DIAGNOSIS — Z3483 Encounter for supervision of other normal pregnancy, third trimester: Secondary | ICD-10-CM | POA: Diagnosis present

## 2015-11-29 LAB — CBC
HEMATOCRIT: 36.9 % (ref 36.0–46.0)
HEMOGLOBIN: 12.9 g/dL (ref 12.0–15.0)
MCH: 29.1 pg (ref 26.0–34.0)
MCHC: 35 g/dL (ref 30.0–36.0)
MCV: 83.1 fL (ref 78.0–100.0)
Platelets: 222 10*3/uL (ref 150–400)
RBC: 4.44 MIL/uL (ref 3.87–5.11)
RDW: 14.3 % (ref 11.5–15.5)
WBC: 15.5 10*3/uL — AB (ref 4.0–10.5)

## 2015-11-29 LAB — ABO/RH: ABO/RH(D): AB POS

## 2015-11-29 LAB — TYPE AND SCREEN
ABO/RH(D): AB POS
ANTIBODY SCREEN: NEGATIVE

## 2015-11-29 MED ORDER — TETANUS-DIPHTH-ACELL PERTUSSIS 5-2.5-18.5 LF-MCG/0.5 IM SUSP: 0.5000 mL | Freq: Once | INTRAMUSCULAR | Status: DC

## 2015-11-29 MED ORDER — BENZOCAINE-MENTHOL 20-0.5 % EX AERO
1.0000 "application " | INHALATION_SPRAY | CUTANEOUS | Status: DC | PRN
  Administered 2015-11-29: 1 via TOPICAL
  Filled 2015-11-29: qty 56

## 2015-11-29 MED ORDER — OXYTOCIN 40 UNITS IN LACTATED RINGERS INFUSION - SIMPLE MED
2.5000 [IU]/h | INTRAVENOUS | Status: DC
  Filled 2015-11-29: qty 1000

## 2015-11-29 MED ORDER — LACTATED RINGERS IV SOLN
500.0000 mL | Freq: Once | INTRAVENOUS | Status: AC
  Administered 2015-11-29: 500 mL via INTRAVENOUS

## 2015-11-29 MED ORDER — DIPHENHYDRAMINE HCL 25 MG PO CAPS: 25.0000 mg | ORAL_CAPSULE | Freq: Four times a day (QID) | ORAL | Status: DC | PRN

## 2015-11-29 MED ORDER — EPHEDRINE 5 MG/ML INJ
10.0000 mg | INTRAVENOUS | Status: DC | PRN
  Filled 2015-11-29: qty 4

## 2015-11-29 MED ORDER — ZOLPIDEM TARTRATE 5 MG PO TABS: 5.0000 mg | ORAL_TABLET | Freq: Every evening | ORAL | Status: DC | PRN

## 2015-11-29 MED ORDER — FENTANYL CITRATE (PF) 100 MCG/2ML IJ SOLN
50.0000 ug | INTRAMUSCULAR | Status: DC | PRN
  Administered 2015-11-29: 100 ug via INTRAVENOUS
  Filled 2015-11-29: qty 2

## 2015-11-29 MED ORDER — LIDOCAINE HCL (PF) 1 % IJ SOLN
30.0000 mL | INTRAMUSCULAR | Status: DC | PRN
  Filled 2015-11-29: qty 30

## 2015-11-29 MED ORDER — SOD CITRATE-CITRIC ACID 500-334 MG/5ML PO SOLN: 30.0000 mL | ORAL | Status: DC | PRN

## 2015-11-29 MED ORDER — PENICILLIN G POTASSIUM 5000000 UNITS IJ SOLR
5.0000 10*6.[IU] | Freq: Once | INTRAVENOUS | Status: AC
  Administered 2015-11-29: 5 10*6.[IU] via INTRAVENOUS
  Filled 2015-11-29: qty 5

## 2015-11-29 MED ORDER — PENICILLIN G POTASSIUM 5000000 UNITS IJ SOLR
2.5000 10*6.[IU] | INTRAMUSCULAR | Status: DC
  Administered 2015-11-29: 2.5 10*6.[IU] via INTRAVENOUS
  Filled 2015-11-29 (×3): qty 2.5

## 2015-11-29 MED ORDER — PHENYLEPHRINE 40 MCG/ML (10ML) SYRINGE FOR IV PUSH (FOR BLOOD PRESSURE SUPPORT)
80.0000 ug | PREFILLED_SYRINGE | INTRAVENOUS | Status: DC | PRN
  Filled 2015-11-29: qty 5

## 2015-11-29 MED ORDER — FENTANYL 2.5 MCG/ML BUPIVACAINE 1/10 % EPIDURAL INFUSION (WH - ANES)
14.0000 mL/h | INTRAMUSCULAR | Status: DC | PRN
  Administered 2015-11-29: 14 mL/h via EPIDURAL

## 2015-11-29 MED ORDER — OXYCODONE HCL 5 MG PO TABS: 10.0000 mg | ORAL_TABLET | ORAL | Status: DC | PRN

## 2015-11-29 MED ORDER — PHENYLEPHRINE 40 MCG/ML (10ML) SYRINGE FOR IV PUSH (FOR BLOOD PRESSURE SUPPORT)
PREFILLED_SYRINGE | INTRAVENOUS | Status: AC
  Filled 2015-11-29: qty 20

## 2015-11-29 MED ORDER — ONDANSETRON HCL 4 MG/2ML IJ SOLN
4.0000 mg | INTRAMUSCULAR | Status: DC | PRN
Start: 2015-11-29 — End: 2015-12-01

## 2015-11-29 MED ORDER — ACETAMINOPHEN 325 MG PO TABS: 650.0000 mg | ORAL_TABLET | ORAL | Status: DC | PRN

## 2015-11-29 MED ORDER — OXYTOCIN BOLUS FROM INFUSION
500.0000 mL | Freq: Once | INTRAVENOUS | Status: AC
  Administered 2015-11-29: 500 mL via INTRAVENOUS

## 2015-11-29 MED ORDER — WITCH HAZEL-GLYCERIN EX PADS
1.0000 "application " | MEDICATED_PAD | CUTANEOUS | Status: DC | PRN
  Administered 2015-11-30: 1 via TOPICAL

## 2015-11-29 MED ORDER — LIDOCAINE HCL (PF) 1 % IJ SOLN
INTRAMUSCULAR | Status: DC | PRN
  Administered 2015-11-29 (×2): 5 mL

## 2015-11-29 MED ORDER — LACTATED RINGERS IV SOLN: 500.0000 mL | INTRAVENOUS | Status: DC | PRN

## 2015-11-29 MED ORDER — DIBUCAINE 1 % RE OINT
1.0000 "application " | TOPICAL_OINTMENT | RECTAL | Status: DC | PRN
  Administered 2015-11-30: 1 via RECTAL
  Filled 2015-11-29: qty 28

## 2015-11-29 MED ORDER — LACTATED RINGERS IV SOLN
INTRAVENOUS | Status: DC
  Administered 2015-11-29 (×2): via INTRAVENOUS

## 2015-11-29 MED ORDER — ONDANSETRON HCL 4 MG/2ML IJ SOLN: 4.0000 mg | Freq: Four times a day (QID) | INTRAMUSCULAR | Status: DC | PRN

## 2015-11-29 MED ORDER — COCONUT OIL OIL
1.0000 "application " | TOPICAL_OIL | Status: DC | PRN
  Administered 2015-11-30: 1 via TOPICAL
  Filled 2015-11-29: qty 120

## 2015-11-29 MED ORDER — IBUPROFEN 600 MG PO TABS
600.0000 mg | ORAL_TABLET | Freq: Four times a day (QID) | ORAL | Status: DC
  Administered 2015-11-30 – 2015-12-01 (×7): 600 mg via ORAL
  Filled 2015-11-29 (×7): qty 1

## 2015-11-29 MED ORDER — OXYCODONE-ACETAMINOPHEN 5-325 MG PO TABS: 1.0000 | ORAL_TABLET | ORAL | Status: DC | PRN

## 2015-11-29 MED ORDER — DIPHENHYDRAMINE HCL 50 MG/ML IJ SOLN: 12.5000 mg | INTRAMUSCULAR | Status: DC | PRN

## 2015-11-29 MED ORDER — SIMETHICONE 80 MG PO CHEW
80.0000 mg | CHEWABLE_TABLET | ORAL | Status: DC | PRN
Start: 2015-11-29 — End: 2015-12-01

## 2015-11-29 MED ORDER — FLEET ENEMA 7-19 GM/118ML RE ENEM: 1.0000 | ENEMA | RECTAL | Status: DC | PRN

## 2015-11-29 MED ORDER — OXYCODONE HCL 5 MG PO TABS: 5.0000 mg | ORAL_TABLET | ORAL | Status: DC | PRN

## 2015-11-29 MED ORDER — PRENATAL MULTIVITAMIN CH
1.0000 | ORAL_TABLET | Freq: Every day | ORAL | Status: DC
  Administered 2015-11-30 – 2015-12-01 (×2): 1 via ORAL
  Filled 2015-11-29 (×2): qty 1

## 2015-11-29 MED ORDER — ONDANSETRON HCL 4 MG PO TABS: 4.0000 mg | ORAL_TABLET | ORAL | Status: DC | PRN

## 2015-11-29 MED ORDER — OXYCODONE-ACETAMINOPHEN 5-325 MG PO TABS: 2.0000 | ORAL_TABLET | ORAL | Status: DC | PRN

## 2015-11-29 MED ORDER — FENTANYL 2.5 MCG/ML BUPIVACAINE 1/10 % EPIDURAL INFUSION (WH - ANES)
INTRAMUSCULAR | Status: AC
  Filled 2015-11-29: qty 125

## 2015-11-29 MED ORDER — SENNOSIDES-DOCUSATE SODIUM 8.6-50 MG PO TABS
2.0000 | ORAL_TABLET | ORAL | Status: DC
  Administered 2015-11-30 (×2): 2 via ORAL
  Filled 2015-11-29 (×2): qty 2

## 2015-11-29 NOTE — H&P (Signed)
LABOR ADMISSION HISTORY AND PHYSICAL  Tammy Gardner is a 25 y.o. female G2P0010 with IUP at 6844w0d by LMP c/w 19wk sono presenting for SOL. She reports +FMs, No LOF, no VB, no blurry vision, headaches or peripheral edema, and RUQ pain.  She plans on breast feeding. She  Is unsure about method of contraception post delivery.  Dating: By LMP c/w 19 wk sono --->  Estimated Date of Delivery: 11/29/15  Prenatal History/Complications:  Past Medical History: Past Medical History:  Diagnosis Date  . Bacterial vaginosis   . Chlamydia   . Medical history non-contributory   . Trichomonas     Past Surgical History: Past Surgical History:  Procedure Laterality Date  . INDUCED ABORTION    . termination      Obstetrical History: OB History    Gravida Para Term Preterm AB Living   2 0 0 0 1 0   SAB TAB Ectopic Multiple Live Births   0 1 0 0        Social History: Social History   Social History  . Marital status: Single    Spouse name: N/A  . Number of children: N/A  . Years of education: N/A   Social History Main Topics  . Smoking status: Former Smoker    Types: Cigarettes  . Smokeless tobacco: Never Used  . Alcohol use No     Comment: no use since beginning of pregnancy  . Drug use: No     Comment: no use since beginning of pregnancy  . Sexual activity: Yes   Other Topics Concern  . None   Social History Narrative  . None    Family History: Family History  Problem Relation Age of Onset  . Hypertension Mother     Allergies: No Known Allergies  Prescriptions Prior to Admission  Medication Sig Dispense Refill Last Dose  . NIFEdipine (PROCARDIA) 10 MG capsule Take 1 capsule (10 mg total) by mouth 3 (three) times daily as needed. (Patient not taking: Reported on 10/23/2015) 60 capsule 0 Not Taking  . Prenatal Vit-Fe Fumarate-FA (PRENATAL MULTIVITAMIN) TABS tablet Take 1 tablet by mouth daily at 12 noon.   Taking     Review of Systems   All systems reviewed and  negative except as stated in HPI  Blood pressure 137/70, pulse 72, temperature 97.9 F (36.6 C), temperature source Oral, resp. rate 18, last menstrual period 02/22/2015. General appearance: alert and cooperative Lungs: clear to auscultation bilaterally Heart: regular rate and rhythm Abdomen: soft, non-tender; bowel sounds normal Extremities: Homans sign is negative, no sign of DVT Presentation: cephalic Fetal monitoringBaseline: 135 bpm, Variability: Good {> 6 bpm), Accelerations: Reactive and Decelerations: Absent Uterine activityFrequency: Every 3-5 minutes Dilation: 4 Effacement (%): 90 Station: -3 Exam by:: Dione PloverA. Jones RNC   Prenatal labs: ABO, Rh: AB/POS/-- (02/28 1448) Antibody: NEG (02/28 1448) Rubella: !Error! RPR: Non Reactive (06/01 1105)  HBsAg: NEGATIVE (02/28 1448)  HIV: Non Reactive (06/01 1105)  GBS:   Positive  2 hr Glucola 72/110/115 Genetic screening: Declined  Anatomy US: Normal   Prenatal Transfer Tool  Maternal Diabetes: No Genetic Screening: Declined Maternal Ultrasounds/Referrals: Normal Fetal Ultrasounds or other Referrals:  None Maternal Substance Abuse:  No Significant Maternal Medications:  None Significant Maternal Lab Results: Lab values include: Group B Strep positive  No results found for this or any previous visit (from the past 24 hour(s)).  Patient Active Problem List   Diagnosis Date Noted  . Encounter for supervision of other  normal pregnancy in third trimester 11/26/2015    Assessment: Tammy Gardner is a 25 y.o. G2P0010 at [redacted]w[redacted]d here for SOL.   #Labor:expectant management, patient wishes to avoid Pitocin if possible  #Pain: IV Fentanyl, Epidural upon request  #FWB: Cat I  #ID:  GBS+ >> PCN  #MOF: Breast  #MOC:Unsure  #Circ:  N/A   De Hollingshead 11/29/2015, 10:23 AM  OB FELLOW HISTORY AND PHYSICAL ATTESTATION  I have seen and examined this patient; I agree with above documentation in the resident's note.     Tammy Gardner 11/29/2015, 3:45 PM

## 2015-11-29 NOTE — Progress Notes (Signed)
Patient found to be 9cm with bulging bag. AROM performed with clear fluid. FHT reassuring. Cervical exam s/p AROM: 9/100/0. Anticipate NSVD.   Marcy Sirenatherine Derriana Oser, D.O. 11/29/2015, 4:06 PM PGY-2, [facility]

## 2015-11-29 NOTE — Anesthesia Procedure Notes (Signed)
Epidural Patient location during procedure: OB  Staffing Anesthesiologist: Perrion Diesel Performed: anesthesiologist   Preanesthetic Checklist Completed: patient identified, site marked, surgical consent, pre-op evaluation, timeout performed, IV checked, risks and benefits discussed and monitors and equipment checked  Epidural Patient position: sitting Prep: DuraPrep Patient monitoring: heart rate, continuous pulse ox and blood pressure Approach: midline Location: L3-L4 Injection technique: LOR saline  Needle:  Needle type: Tuohy  Needle gauge: 17 G Needle length: 9 cm and 9 Needle insertion depth: 6 cm Catheter type: closed end flexible Catheter size: 20 Guage Catheter at skin depth: 10 cm Test dose: negative  Assessment Events: blood not aspirated, injection not painful, no injection resistance, negative IV test and no paresthesia  Additional Notes Patient identified. Risks/Benefits/Options discussed with patient including but not limited to bleeding, infection, nerve damage, paralysis, failed block, incomplete pain control, headache, blood pressure changes, nausea, vomiting, reactions to medication both or allergic, itching and postpartum back pain. Confirmed with bedside nurse the patient's most recent platelet count. Confirmed with patient that they are not currently taking any anticoagulation, have any bleeding history or any family history of bleeding disorders. Patient expressed understanding and wished to proceed. All questions were answered. Sterile technique was used throughout the entire procedure. Please see nursing notes for vital signs. Test dose was given through epidural needle and negative prior to continuing to dose epidural or start infusion. Warning signs of high block given to the patient including shortness of breath, tingling/numbness in hands, complete motor block, or any concerning symptoms with instructions to call for help. Patient was given instructions  on fall risk and not to get out of bed. All questions and concerns addressed with instructions to call with any issues.     

## 2015-11-29 NOTE — Anesthesia Preprocedure Evaluation (Signed)

## 2015-11-29 NOTE — MAU Note (Signed)
Pt uncooperative in answering questions. Pt states she is "in pain and can't feel anything else", including fetal movement. Pt states she had some bloody show yesterday. Pt refuses to answer any additional questions.

## 2015-11-29 NOTE — Anesthesia Pain Management Evaluation Note (Signed)
  CRNA Pain Management Visit Note  Patient: Tammy Gardner, 25 y.o., female  "Hello I am a member of the anesthesia team at [facility]. We have an anesthesia team available at all times to provide care throughout the hospital, including epidural management and anesthesia for C-section. I don't know your plan for the delivery whether it a natural birth, water birth, IV sedation, nitrous supplementation, doula or epidural, but we want to meet your pain goals."   1.Was your pain managed to your expectations on prior hospitalizations?   No prior hospitalizations  2.What is your expectation for pain management during this hospitalization?     Epidural  3.How can we help you reach that goal? Assist as needed  Record the patient's initial score and the patient's pain goal.   Pain: 0  Pain Goal: 2 [facility] wants you to be able to say your pain was always managed very well.  [facility]INKLE,Tammy Gardner 11/29/2015

## 2015-11-30 LAB — RPR: RPR: NONREACTIVE

## 2015-11-30 NOTE — Progress Notes (Signed)
Post Partum Day 1 Subjective: no complaints, up ad lib, voiding and tolerating PO  Objective: Blood pressure 100/70, pulse 72, temperature 98.8 F (37.1 C), temperature source Oral, resp. rate (!) 22, height 5' (1.524 m), weight 156 lb (70.8 kg), last menstrual period 02/22/2015, SpO2 100 %, unknown if currently breastfeeding.  Physical Exam:  General: alert, cooperative, appears stated age and no distress Lochia: appropriate Uterine Fundus: firm Incision: n/a DVT Evaluation: No evidence of DVT seen on physical exam. Negative Homan's sign. No cords or calf tenderness.   Recent Labs  11/29/15 1032  HGB 12.9  HCT 36.9    Assessment/Plan: Plan for discharge tomorrow   LOS: 1 day   Tammy Gardner 11/30/2015, 7:14 AM

## 2015-11-30 NOTE — Anesthesia Postprocedure Evaluation (Signed)
Anesthesia Post Note  Patient: Tammy Gardner  Procedure(s) Performed: * No procedures listed *  Patient location during evaluation: Mother Baby Anesthesia Type: Epidural Level of consciousness: awake and alert, oriented and patient cooperative Pain management: pain level controlled Vital Signs Assessment: post-procedure vital signs reviewed and stable Respiratory status: spontaneous breathing Cardiovascular status: stable Postop Assessment: no headache, epidural receding, patient able to bend at knees and no signs of nausea or vomiting Anesthetic complications: no Comments: Denies pain.     Last Vitals:  Vitals:   11/30/15 0130 11/30/15 0605  BP: (!) 127/59 100/70  Pulse: 74 72  Resp: 18 (!) 22  Temp: 37.2 C 37.1 C    Last Pain:  Vitals:   11/30/15 0605  TempSrc: Oral  PainSc:    Pain Goal:                 [facility] LLCWRINKLE,[name]

## 2015-11-30 NOTE — Lactation Note (Signed)
This note was copied from a baby's chart. Lactation Consultation Note  Mother reports baby is BF well but that nipples are getting sore. Assisted with positioning and a deeper latch and mother reported increased comfort. She mentioned " I'm not sure how long I can do this". Lots of encouragement given from [facility] and pt's sister.  Hand expression taught with colostrum visible. Information given on support groups and outpatient services. Follow-up tomorrow.  Patient Name: Tammy Gardner QMVHQ'IToday's Date: 11/30/2015 Reason for consult: Initial assessment   Maternal Data Has patient been taught Hand Expression?: Yes Does the patient have breastfeeding experience prior to this delivery?: No  Feeding Feeding Type: Breast Fed  LATCH Score/Interventions Latch: Repeated attempts needed to sustain latch, nipple held in mouth throughout feeding, stimulation needed to elicit sucking reflex.  Audible Swallowing: A few with stimulation  Type of Nipple: Everted at rest and after stimulation  Comfort (Breast/Nipple): Soft / non-tender     Hold (Positioning): Assistance needed to correctly position infant at breast and maintain latch.  LATCH Score: 7  Lactation Tools Discussed/Used     Consult Status Consult Status: Follow-up Date: 12/01/15 Follow-up type: In-patient    Soyla DryerJoseph, [name] 11/30/2015, 4:01 PM

## 2015-12-01 MED ORDER — SENNOSIDES-DOCUSATE SODIUM 8.6-50 MG PO TABS: 2.0000 | ORAL_TABLET | ORAL | 0 refills | Status: DC

## 2015-12-01 NOTE — Discharge Instructions (Signed)

## 2015-12-01 NOTE — Discharge Summary (Signed)
OB Discharge Summary     Patient Name: Tammy Gardner DOB: 06-23-1990 MRN: [id number]  Date of admission: 11/29/2015 Delivering MD: Arvilla Market   Date of discharge: 12/01/2015  Admitting diagnosis: 40 weeks having bad contractions Intrauterine pregnancy: [redacted]w[redacted]d     Secondary diagnosis:  Active Problems:   Spontaneous vaginal delivery  Additional problems: None      Discharge diagnosis: Term Pregnancy Delivered                                                                                                Post partum procedures:none  Augmentation: none  Complications: None  Hospital course:  Onset of Labor With Vaginal Delivery     25 y.o. yo G2P1011 at [redacted]w[redacted]d was admitted in Active Labor on 11/29/2015. Patient had an uncomplicated labor course as follows:  Membrane Rupture Time/Date: 3:54 PM ,11/29/2015   Intrapartum Procedures: Episiotomy: None [1]                                         Lacerations:  Periurethral [8];Sulcus [9];Labial [10];2nd degree [3]  Patient had a delivery of a Viable infant. 11/29/2015  Information for the patient's newborn:  Nyema, Hachey [[id number]]  Delivery Method: Vaginal, Spontaneous Delivery (Filed from Delivery Summary)    Pateint had an uncomplicated postpartum course.  She is ambulating, tolerating a regular diet, passing flatus, and urinating well. Patient is discharged home in stable condition on 12/01/15.    Physical exam  Vitals:   11/30/15 0130 11/30/15 0605 11/30/15 1905 12/01/15 0624  BP: (!) 127/59 100/70 125/65 118/60  Pulse: 74 72 71 71  Resp: 18 (!) 22 18 18   Temp: 99 F (37.2 C) 98.8 F (37.1 C) 98.8 F (37.1 C) 97.7 F (36.5 C)  TempSrc: Oral Oral Oral Oral  SpO2:  100%  98%  Weight:      Height:       General: alert and cooperative Lochia: appropriate Uterine Fundus: firm Incision: N/A DVT Evaluation: No evidence of DVT seen on physical exam. Labs: Lab Results  Component Value Date   WBC  15.5 (H) 11/29/2015   HGB 12.9 11/29/2015   HCT 36.9 11/29/2015   MCV 83.1 11/29/2015   PLT 222 11/29/2015   No flowsheet data found.  Discharge instruction: per After Visit Summary and "Baby and Me Booklet".  After visit meds:    Medication List    TAKE these medications   senna-docusate 8.6-50 MG tablet Commonly known as:  Senokot-S Take 2 tablets by mouth daily.       Diet: routine diet  Activity: Advance as tolerated. Pelvic rest for 6 weeks.   Outpatient follow up:6 weeks Follow up Appt: Future Appointments Date Time Provider Department Center  12/03/2015 10:00 AM Roe Coombs, CNM [facility]   Follow up Visit:No Follow-up on file.  Postpartum contraception: None  Newborn Data: Live born female  Birth Weight: 5 lb 7.5 oz (2480 g) APGAR: 9, 9  Baby Feeding:  Breast Disposition:home with mother   12/01/2015 De Hollingsheadatherine L Wallace, DO  OB FELLOW DISCHARGE ATTESTATION  I have seen and examined this patient and agree with above documentation in the resident's note.   Ernestina PennaNicholas Fredrick Dray, MD 9:45 AM

## 2015-12-01 NOTE — Lactation Note (Signed)
This note was copied from a baby's chart. Lactation Consultation Note: Mother states that infant bits and her nipples were sore so she gave infant a bottle during the night. She also pumped 20 ml with the harmony hand pump. Mother unsure now if she just wants to pump breast and not breastfeed.   Lots of teaching. Advised mother to pump every 2-3 hours for 15 mins on each breast if chooses to pump only. Mother states she plans to buy a DEBP.  Encouraged mother to page for [facility] when infant awake and ready for next feeding. I will check latch when mother calls. Reviewed treatment to prevent engorgement. Mother to continue to breastfeed 8-12 times in 24 hours and do frequent skin to skin and limit use of pacifier. Discussed cue base feeding and cluster feeding.  Mother is aware of available lactation services and community support.   Patient Name: Tammy Gardner [id number]'WToday's Date: 12/01/2015 Reason for consult: Follow-up assessment   Maternal Data    Feeding Feeding Type: Bottle Fed - Formula Nipple Type: Slow - flow  LATCH Score/Interventions                      Lactation Tools Discussed/Used     Consult Status Consult Status: Complete    Michel BickersKendrick, Paulina Muchmore McCoy 12/01/2015, 9:43 AM

## 2015-12-01 NOTE — Lactation Note (Signed)
This note was copied from a baby's chart. Lactation Consultation Note: Mother has pumped 2 ounces with hand pump. Mother prefers to offer infant EBM in a bottle. Advised in paced bottle feeding. Mother declines having any concerns or need for lactation at this time. She states she wants to use pump now.   Patient Name: Tammy Gardner Reason for consult: Follow-up assessment   Maternal Data    Feeding    LATCH Score/Interventions                      Lactation Tools Discussed/Used     Consult Status Consult Status: Complete    Tammy Gardner, Tammy Gardner Gardner, 12:04 PM

## 2015-12-03 ENCOUNTER — Encounter: Payer: Medicaid Other | Admitting: Certified Nurse Midwife

## 2015-12-03 ENCOUNTER — Other Ambulatory Visit: Payer: Self-pay | Admitting: Certified Nurse Midwife

## 2015-12-17 ENCOUNTER — Encounter: Payer: Self-pay | Admitting: Obstetrics

## 2015-12-17 ENCOUNTER — Ambulatory Visit (INDEPENDENT_AMBULATORY_CARE_PROVIDER_SITE_OTHER): Payer: Medicaid Other | Admitting: Obstetrics

## 2015-12-17 DIAGNOSIS — B373 Candidiasis of vulva and vagina: Secondary | ICD-10-CM

## 2015-12-17 DIAGNOSIS — Z30011 Encounter for initial prescription of contraceptive pills: Secondary | ICD-10-CM

## 2015-12-17 DIAGNOSIS — N760 Acute vaginitis: Secondary | ICD-10-CM

## 2015-12-17 DIAGNOSIS — B3731 Acute candidiasis of vulva and vagina: Secondary | ICD-10-CM

## 2015-12-17 DIAGNOSIS — B9689 Other specified bacterial agents as the cause of diseases classified elsewhere: Secondary | ICD-10-CM

## 2015-12-17 DIAGNOSIS — Z3009 Encounter for other general counseling and advice on contraception: Secondary | ICD-10-CM

## 2015-12-17 MED ORDER — FLUCONAZOLE 150 MG PO TABS: 150.0000 mg | ORAL_TABLET | Freq: Once | ORAL | 2 refills | Status: AC

## 2015-12-17 MED ORDER — NORETHINDRONE 0.35 MG PO TABS: 1.0000 | ORAL_TABLET | Freq: Every day | ORAL | 11 refills | Status: DC

## 2015-12-17 MED ORDER — VITAFOL GUMMIES 3.33-0.333-34.8 MG PO CHEW: 3.0000 | CHEWABLE_TABLET | Freq: Every day | ORAL | 11 refills | Status: DC

## 2015-12-17 MED ORDER — CLINDAMYCIN HCL 300 MG PO CAPS: 300.0000 mg | ORAL_CAPSULE | Freq: Three times a day (TID) | ORAL | 0 refills | Status: DC

## 2015-12-17 NOTE — Progress Notes (Signed)
Subjective:     Tammy Gardner is a 25 y.o. female who presents for a postpartum visit. She is 2 weeks postpartum following a spontaneous vaginal delivery. I have fully reviewed the prenatal and intrapartum course. The delivery was at 40 gestational weeks. Outcome: spontaneous vaginal delivery. Anesthesia: epidural. Postpartum course has been normal. Baby's course has been normal. Baby is feeding by breast. Bleeding thin lochia. Bowel function is normal. Bladder function is normal. Patient is not sexually active. Contraception method is abstinence. Postpartum depression screening: negative.  Tobacco, alcohol and substance abuse history reviewed.  Adult immunizations reviewed including TDAP, rubella and varicella.  The following portions of the patient's history were reviewed and updated as appropriate: allergies, current medications, past family history, past medical history, past social history, past surgical history and problem list.  Review of Systems A comprehensive ROS was negative except for malodorous vaginal discharge.  Denies vaginal itching.  Objective:    BP 123/78   Pulse 71   Temp 98.6 F (37 C)   Wt 138 lb (62.6 kg)   Breastfeeding? Yes   BMI 26.95 kg/m    PE:  Deferred   >50% of 15 min visit spent on counseling and coordination of care.   Assessment:    2 weeks postpartum.  Doing well.  Malodorous vaginal discharge.  Probable BV infection.  Contraceptive counseling and advice.  Wants OCP's.  Plan:    1. Contraception: oral progesterone-only contraceptive 2. Clindamycin Rx for BV 3. Follow up in: 4 weeks or as needed.   Healthy lifestyle practices reviewed

## 2015-12-18 ENCOUNTER — Encounter: Payer: Self-pay | Admitting: Obstetrics

## 2016-01-14 ENCOUNTER — Ambulatory Visit: Payer: Self-pay | Admitting: Obstetrics

## 2016-01-15 ENCOUNTER — Encounter: Payer: Self-pay | Admitting: Obstetrics

## 2016-01-15 ENCOUNTER — Other Ambulatory Visit (HOSPITAL_COMMUNITY)
Admission: RE | Admit: 2016-01-15 | Discharge: 2016-01-15 | Disposition: A | Discharge status: Home / Self Care | Payer: Medicaid Other | Source: Ambulatory Visit | Attending: Obstetrics | Admitting: Obstetrics

## 2016-01-15 ENCOUNTER — Ambulatory Visit (INDEPENDENT_AMBULATORY_CARE_PROVIDER_SITE_OTHER): Payer: Medicaid Other | Admitting: Obstetrics

## 2016-01-15 ENCOUNTER — Encounter: Payer: Self-pay | Admitting: *Deleted

## 2016-01-15 DIAGNOSIS — Z3009 Encounter for other general counseling and advice on contraception: Secondary | ICD-10-CM | POA: Diagnosis not present

## 2016-01-15 DIAGNOSIS — Z113 Encounter for screening for infections with a predominantly sexual mode of transmission: Secondary | ICD-10-CM | POA: Insufficient documentation

## 2016-01-15 DIAGNOSIS — Z3202 Encounter for pregnancy test, result negative: Secondary | ICD-10-CM

## 2016-01-15 DIAGNOSIS — N912 Amenorrhea, unspecified: Secondary | ICD-10-CM | POA: Diagnosis not present

## 2016-01-15 DIAGNOSIS — N760 Acute vaginitis: Secondary | ICD-10-CM | POA: Insufficient documentation

## 2016-01-15 LAB — POCT URINE PREGNANCY: Preg Test, Ur: NEGATIVE

## 2016-01-16 ENCOUNTER — Encounter: Payer: Self-pay | Admitting: Obstetrics

## 2016-01-16 NOTE — Progress Notes (Signed)
Subjective:     Tammy Gardner is a 25 y.o. female who presents for a postpartum visit. She is 6 weeks postpartum following a spontaneous vaginal delivery. I have fully reviewed the prenatal and intrapartum course. The delivery was at 40 gestational weeks. Outcome: spontaneous vaginal delivery. Anesthesia: epidural. Postpartum course has been normal. Baby's course has been normal. Baby is feeding by both breast and bottle - Similac Advance. Bleeding no bleeding. Bowel function is normal. Bladder function is normal. Patient is not sexually active. Contraception method is abstinence. Postpartum depression screening: negative.  Tobacco, alcohol and substance abuse history reviewed.  Adult immunizations reviewed including TDAP, rubella and varicella.  The following portions of the patient's history were reviewed and updated as appropriate: allergies, current medications, past family history, past medical history, past social history, past surgical history and problem list.  Review of Systems A comprehensive review of systems was negative.   Objective:    BP 133/83   Pulse 69   Wt 134 lb (60.8 kg)   Breastfeeding? Yes   BMI 26.17 kg/m   General:  alert and no distress   Breasts:  inspection negative, no nipple discharge or bleeding, no masses or nodularity palpable  Lungs: clear to auscultation bilaterally  Heart:  regular rate and rhythm, S1, S2 normal, no murmur, click, rub or gallop  Abdomen: soft, non-tender; bowel sounds normal; no masses,  no organomegaly   Vulva:  normal  Vagina: normal vagina  Cervix:  no cervical motion tenderness  Corpus: normal size, contour, position, consistency, mobility, non-tender  Adnexa:  no mass, fullness, tenderness  Rectal Exam: Not performed.          50% of 15min visit spent on counseling and coordination of care.   Assessment:     Normal postpartum exam. Pap smear not done at today's visit.     1. Contraception: condoms 2. Continue PNV's 3.  Follow up in: 6 weeks or as needed.   Healthy lifestyle practices reviewed

## 2016-01-19 LAB — CERVICOVAGINAL ANCILLARY ONLY
CHLAMYDIA, DNA PROBE: NEGATIVE
NEISSERIA GONORRHEA: NEGATIVE
Trichomonas: NEGATIVE

## 2016-01-21 ENCOUNTER — Other Ambulatory Visit: Payer: Self-pay | Admitting: Obstetrics

## 2016-01-21 ENCOUNTER — Encounter: Payer: Self-pay | Admitting: *Deleted

## 2016-01-21 ENCOUNTER — Institutional Professional Consult (permissible substitution): Payer: Medicaid Other

## 2016-01-21 ENCOUNTER — Telehealth: Payer: Self-pay | Admitting: *Deleted

## 2016-01-21 DIAGNOSIS — F53 Postpartum depression: Secondary | ICD-10-CM

## 2016-01-21 DIAGNOSIS — O99345 Other mental disorders complicating the puerperium: Secondary | ICD-10-CM

## 2016-01-21 LAB — CERVICOVAGINAL ANCILLARY ONLY
BACTERIAL VAGINITIS: NEGATIVE
Candida vaginitis: NEGATIVE

## 2016-01-21 NOTE — Telephone Encounter (Signed)
Patient called yesterday and wanted to change her date to return to work. After conversation she states she thinks she has post partum depression- her scores were 0/4 and she has been medically cleared and I told her we could not change the dating on her paperwork.Patient got mad and hung up.  Patient has called again today and after speaking to Dr Clearance CootsHarper - she has expressed she felt fine at her post partum appointment- but since then she has had trouble controlling her emotions and she feels she can't and is not ready to return to work. Per Dr Clearance CootsHarper- He will extend her leave until she is evaluated by mental health and then they will decide her plan for work from there. Referral to be made.

## 2016-01-21 NOTE — Telephone Encounter (Signed)
Telephone call to patient with appointment for today at 1pm to see Marijean NiemannJaime [facility] Behavior[facility].  Patient is now declining this appointment.  Her plan is to return to work as previously agreed upon and if she feels she is having any issues going forward she will contact us back for [facility].

## 2016-02-03 ENCOUNTER — Other Ambulatory Visit: Payer: Self-pay | Admitting: Obstetrics

## 2016-02-03 DIAGNOSIS — N760 Acute vaginitis: Principal | ICD-10-CM

## 2016-02-03 DIAGNOSIS — B373 Candidiasis of vulva and vagina: Secondary | ICD-10-CM

## 2016-02-03 DIAGNOSIS — B3731 Acute candidiasis of vulva and vagina: Secondary | ICD-10-CM

## 2016-02-03 DIAGNOSIS — B9689 Other specified bacterial agents as the cause of diseases classified elsewhere: Secondary | ICD-10-CM

## 2016-02-03 MED ORDER — FLUCONAZOLE 150 MG PO TABS: 150.0000 mg | ORAL_TABLET | Freq: Once | ORAL | 2 refills | Status: AC

## 2016-02-03 MED ORDER — METRONIDAZOLE 500 MG PO TABS: 500.0000 mg | ORAL_TABLET | Freq: Two times a day (BID) | ORAL | 2 refills | Status: DC

## 2016-02-19 ENCOUNTER — Encounter: Payer: Self-pay | Admitting: *Deleted

## 2016-02-19 ENCOUNTER — Telehealth: Payer: Self-pay | Admitting: *Deleted

## 2016-02-19 NOTE — Telephone Encounter (Signed)
Patient called to get help with her job. She wants us to know why she did not return to work as planned. She did not want to tell anyone- but she had had a falling out with the father of her infant and he had left her. Things were so bad that her mother had to come and stay with her. She truly thought that she could return to work when she was in the office, but she fell apart at home and could not face it. She did pull herself together and return- but not at the date she wanted or agreed upon. She would like a letter stating that sent to her employer so she is not fired from her job.

## 2016-02-20 NOTE — Telephone Encounter (Signed)
OK for note

## 2016-03-04 ENCOUNTER — Ambulatory Visit: Payer: Medicaid Other | Admitting: Obstetrics

## 2016-07-01 ENCOUNTER — Ambulatory Visit: Payer: Self-pay | Admitting: Obstetrics

## 2016-07-21 ENCOUNTER — Ambulatory Visit: Payer: Self-pay | Admitting: Obstetrics

## 2016-08-10 ENCOUNTER — Ambulatory Visit: Payer: Self-pay | Admitting: Obstetrics

## 2016-09-03 ENCOUNTER — Ambulatory Visit: Payer: Self-pay | Admitting: Obstetrics

## 2017-01-31 ENCOUNTER — Other Ambulatory Visit: Payer: Self-pay | Admitting: Certified Nurse Midwife
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
## 2017-06-20 ENCOUNTER — Ambulatory Visit (HOSPITAL_COMMUNITY)
Admission: EM | Admit: 2017-06-20 | Discharge: 2017-06-20 | Disposition: A | Discharge status: Home / Self Care | Payer: Self-pay | Attending: Internal Medicine | Admitting: Internal Medicine

## 2017-06-20 ENCOUNTER — Encounter (HOSPITAL_COMMUNITY): Payer: Self-pay | Admitting: Emergency Medicine

## 2017-06-20 DIAGNOSIS — F17210 Nicotine dependence, cigarettes, uncomplicated: Secondary | ICD-10-CM | POA: Insufficient documentation

## 2017-06-20 DIAGNOSIS — N898 Other specified noninflammatory disorders of vagina: Secondary | ICD-10-CM

## 2017-06-20 LAB — POCT PREGNANCY, URINE: Preg Test, Ur: NEGATIVE

## 2017-06-20 LAB — POCT URINALYSIS DIP (DEVICE)
Bilirubin Urine: NEGATIVE
GLUCOSE, UA: NEGATIVE mg/dL
HGB URINE DIPSTICK: NEGATIVE
KETONES UR: NEGATIVE mg/dL
Nitrite: NEGATIVE
PH: 7 (ref 5.0–8.0)
PROTEIN: NEGATIVE mg/dL
SPECIFIC GRAVITY, URINE: 1.02 (ref 1.005–1.030)
UROBILINOGEN UA: 0.2 mg/dL (ref 0.0–1.0)

## 2017-06-20 MED ORDER — METRONIDAZOLE 500 MG PO TABS: 500.0000 mg | ORAL_TABLET | Freq: Two times a day (BID) | ORAL | 0 refills | Status: AC

## 2017-06-20 NOTE — Discharge Instructions (Signed)
Complete course of antibiotics.  Do not drink alcohol while taking. Avoid intercourse for the next week. Will notify you of any positive findings from your testing and if any changes to treatment are needed.   If symptoms worsen or do not improve in the next week to return to be seen or to follow up with your PCP or gynecologist

## 2017-06-20 NOTE — ED Triage Notes (Signed)
Pt here for vaginal discharge  

## 2017-06-20 NOTE — ED Provider Notes (Signed)
[facility]    CSN: [id number] Arrival date & time: 06/20/17  1202     History   Chief Complaint Chief Complaint  Patient presents with  . Vaginal Discharge    HPI Tammy Gardner is a 27 y.o. female.   Tammy Gardner presents with complaints of vaginal discharge with odor which has been present for the past week. It is thick and white. Denies vaginal itching or pain. Denies vaginal bleeding. LMP was approximately 1 month ago. She is sexually active with one partner and does not regularly use condoms. Denies concern for stds. Denies urinary symptoms. No known fevers, abdominal or back pain. Denies skin breakdown or lesions to vulva. States has had BV in the past and this feels similar, last was approximately 2 years ago.    ROS per HPI.       Past Medical History:  Diagnosis Date  . Bacterial vaginosis   . Chlamydia   . Medical history non-contributory   . Trichomonas     Patient Active Problem List   Diagnosis Date Noted  . Spontaneous vaginal delivery 12/01/2015  . Encounter for supervision of other normal pregnancy in third trimester 11/26/2015    Past Surgical History:  Procedure Laterality Date  . INDUCED ABORTION    . termination      OB History    Gravida Para Term Preterm AB Living   2 1 1  0 1 1   SAB TAB Ectopic Multiple Live Births   0 1 0 0 1       Home Medications    Prior to Admission medications   Medication Sig Start Date End Date Taking? Authorizing Provider  metroNIDAZOLE (FLAGYL) 500 MG tablet Take 1 tablet (500 mg total) by mouth 2 (two) times daily for 7 days. 06/20/17 06/27/17  Georgetta Haber, NP    Family History Family History  Problem Relation Age of Onset  . Hypertension Mother     Social History Social History   Tobacco Use  . Smoking status: Current Some Day Smoker    Types: Cigarettes  . Smokeless tobacco: Never Used  Substance Use Topics  . Alcohol use: No    Comment: no use since beginning of pregnancy  .  Drug use: No    Comment: no use since beginning of pregnancy     Allergies   Patient has no known allergies.   Review of Systems Review of Systems   Physical Exam Triage Vital Signs ED Triage Vitals [06/20/17 1305]  Enc Vitals Group     BP 128/61     Pulse Rate 64     Resp 18     Temp 98 F (36.7 C)     Temp Source Oral     SpO2 100 %     Weight      Height      Head Circumference      Peak Flow      Pain Score      Pain Loc      Pain Edu?      Excl. in GC?    No data found.  Updated Vital Signs BP 128/61 (BP Location: Left Arm)   Pulse 64   Temp 98 F (36.7 C) (Oral)   Resp 18   SpO2 100%   Visual Acuity Right Eye Distance:   Left Eye Distance:   Bilateral Distance:    Right Eye Near:   Left Eye Near:    Bilateral Near:  Physical Exam  Constitutional: She is oriented to person, place, and time. She appears well-developed and well-nourished. No distress.  Cardiovascular: Normal rate, regular rhythm and normal heart sounds.  Pulmonary/Chest: Effort normal and breath sounds normal.  Abdominal: Soft. She exhibits no distension. There is no tenderness. There is no guarding.  Genitourinary:  Genitourinary Comments: Pain, lesions, bleeding denies; gu exam deferred at this time.   Neurological: She is alert and oriented to person, place, and time.  Skin: Skin is warm and dry.     UC Treatments / Results  Labs (all labs ordered are listed, but only abnormal results are displayed) Labs Reviewed  POCT URINALYSIS DIP (DEVICE) - Abnormal; Notable for the following components:      Result Value   Leukocytes, UA TRACE (*)    All other components within normal limits  POCT PREGNANCY, URINE  URINE CYTOLOGY ANCILLARY ONLY    EKG  EKG Interpretation None       Radiology No results found.  Procedures Procedures (including critical care time)  Medications Ordered in UC Medications - No data to display   Initial Impression / Assessment and  Plan / UC Course  I have reviewed the triage vital signs and the nursing notes.  Pertinent labs & imaging results that were available during my care of the patient were reviewed by me and considered in my medical decision making (see chart for details).     Flagyl provided empirically. Urine cytology pending. Will notify of any positive findings and if any changes to treatment are needed.  Patient verbalized understanding and agreeable to plan.    Final Clinical Impressions(s) / UC Diagnoses   Final diagnoses:  Vaginal discharge    ED Discharge Orders        Ordered    metroNIDAZOLE (FLAGYL) 500 MG tablet  2 times daily     06/20/17 1351       Controlled Substance Prescriptions [place] Controlled Substance Registry consulted? Not Applicable   Georgetta HaberBurky, Remona Boom B, NP 06/20/17 1355

## 2017-06-21 LAB — URINE CYTOLOGY ANCILLARY ONLY
Chlamydia: NEGATIVE
NEISSERIA GONORRHEA: NEGATIVE
TRICH (WINDOWPATH): NEGATIVE

## 2017-06-23 LAB — URINE CYTOLOGY ANCILLARY ONLY: Candida vaginitis: NEGATIVE

## 2017-06-29 ENCOUNTER — Ambulatory Visit: Payer: Self-pay | Admitting: Obstetrics

## 2017-09-20 ENCOUNTER — Encounter (HOSPITAL_COMMUNITY): Payer: Self-pay | Admitting: Emergency Medicine

## 2017-09-20 ENCOUNTER — Ambulatory Visit (HOSPITAL_COMMUNITY)
Admission: EM | Admit: 2017-09-20 | Discharge: 2017-09-20 | Disposition: A | Discharge status: Home / Self Care | Payer: Self-pay | Attending: Internal Medicine | Admitting: Internal Medicine

## 2017-09-20 DIAGNOSIS — K0889 Other specified disorders of teeth and supporting structures: Secondary | ICD-10-CM

## 2017-09-20 MED ORDER — PENICILLIN V POTASSIUM 500 MG PO TABS: 500.0000 mg | ORAL_TABLET | Freq: Four times a day (QID) | ORAL | 0 refills | Status: AC

## 2017-09-20 MED ORDER — IBUPROFEN 800 MG PO TABS: 800.0000 mg | ORAL_TABLET | Freq: Three times a day (TID) | ORAL | 0 refills | Status: DC

## 2017-09-20 NOTE — ED Provider Notes (Signed)
[facility]    CSN: [id number] Arrival date & time: 09/20/17  1605     History   Chief Complaint Chief Complaint  Patient presents with  . Dental Pain    HPI Tammy Gardner is a 27 y.o. female.   Tammy Gardner presents with complaints of dental pain which has been worsening over the past two weeks. No fevers. No specific known injury or trauma to the tooth. Has felt like it is loosening. No drainage or foul taste. Denies any previous similar. Does not have a dentist. Pain with chewing. No facial swelling but causes swelling to gum at times. Does not have a dentist. Pain can be severe when it is at it's worst. Has not been taking any medications regularly for pain. Without contributing medical history.  Does smoke.   ROS per HPI.      Past Medical History:  Diagnosis Date  . Bacterial vaginosis   . Chlamydia   . Medical history non-contributory   . Trichomonas     Patient Active Problem List   Diagnosis Date Noted  . Spontaneous vaginal delivery 12/01/2015  . Encounter for supervision of other normal pregnancy in third trimester 11/26/2015    Past Surgical History:  Procedure Laterality Date  . INDUCED ABORTION    . termination      OB History    Gravida  2   Para  1   Term  1   Preterm  0   AB  1   Living  1     SAB  0   TAB  1   Ectopic  0   Multiple  0   Live Births  1            Home Medications    Prior to Admission medications   Medication Sig Start Date End Date Taking? Authorizing Provider  ibuprofen (ADVIL,MOTRIN) 800 MG tablet Take 1 tablet (800 mg total) by mouth 3 (three) times daily. 09/20/17   Georgetta Haber, NP  penicillin v potassium (VEETID) 500 MG tablet Take 1 tablet (500 mg total) by mouth 4 (four) times daily for 7 days. 09/20/17 09/27/17  Georgetta Haber, NP    Family History Family History  Problem Relation Age of Onset  . Hypertension Mother     Social History Social History   Tobacco Use  .  Smoking status: Current Some Day Smoker    Types: Cigarettes  . Smokeless tobacco: Never Used  Substance Use Topics  . Alcohol use: No    Comment: no use since beginning of pregnancy  . Drug use: No    Types: Marijuana    Comment: no use since beginning of pregnancy     Allergies   Patient has no known allergies.   Review of Systems Review of Systems   Physical Exam Triage Vital Signs ED Triage Vitals  Enc Vitals Group     BP 09/20/17 1624 (!) 121/55     Pulse Rate 09/20/17 1624 60     Resp 09/20/17 1624 16     Temp 09/20/17 1624 98.6 F (37 C)     Temp Source 09/20/17 1624 Oral     SpO2 09/20/17 1624 100 %     Weight 09/20/17 1625 110 lb (49.9 kg)     Height --      Head Circumference --      Peak Flow --      Pain Score 09/20/17 1624 8  Pain Loc --      Pain Edu? --      Excl. in GC? --    No data found.  Updated Vital Signs BP (!) 121/55   Pulse 60   Temp 98.6 F (37 C) (Oral)   Resp 16   Wt 110 lb (49.9 kg)   LMP 09/18/2017   SpO2 100%   BMI 21.48 kg/m   Visual Acuity Right Eye Distance:   Left Eye Distance:   Bilateral Distance:    Right Eye Near:   Left Eye Near:    Bilateral Near:     Physical Exam  Constitutional: She is oriented to person, place, and time. She appears well-developed and well-nourished. No distress.  HENT:  Head: Normocephalic and atraumatic.  Mouth/Throat: Dental caries present.  Caries with jaw tenderness at tooth #20  Cardiovascular: Normal rate, regular rhythm and normal heart sounds.  Pulmonary/Chest: Effort normal and breath sounds normal.  Neurological: She is alert and oriented to person, place, and time.  Skin: Skin is warm and dry.     UC Treatments / Results  Labs (all labs ordered are listed, but only abnormal results are displayed) Labs Reviewed - No data to display  EKG None  Radiology No results found.  Procedures Procedures (including critical care time)  Medications Ordered in  UC Medications - No data to display  Initial Impression / Assessment and Plan / UC Course  I have reviewed the triage vital signs and the nursing notes.  Pertinent labs & imaging results that were available during my care of the patient were reviewed by me and considered in my medical decision making (see chart for details).     Pen V and ibuprofen provided. Encouraged follow up with dentist for definitive treatment. Dental resources provided. Patient verbalized understanding and agreeable to plan.    Final Clinical Impressions(s) / UC Diagnoses   Final diagnoses:  Pain, dental     Discharge Instructions     Complete course of antibiotics.  Ibuprofen for pain control, take with food.  Please follow up with dentist for definitive treatment.    ED Prescriptions    Medication Sig Dispense Auth. Provider   penicillin v potassium (VEETID) 500 MG tablet Take 1 tablet (500 mg total) by mouth 4 (four) times daily for 7 days. 28 tablet Linus MakoBurky, Antonia Culbertson B, NP   ibuprofen (ADVIL,MOTRIN) 800 MG tablet Take 1 tablet (800 mg total) by mouth 3 (three) times daily. 21 tablet Georgetta HaberBurky, Jaeson Molstad B, NP     Controlled Substance Prescriptions [place] Controlled Substance Registry consulted? Not Applicable   Georgetta HaberBurky, Jager Koska B, NP 09/20/17 1652

## 2017-09-20 NOTE — ED Triage Notes (Signed)
PT reports dental pain and swelling for 2 months.

## 2017-09-20 NOTE — Discharge Instructions (Signed)
Complete course of antibiotics.  Ibuprofen for pain control, take with food.  Please follow up with dentist for definitive treatment.

## 2017-10-08 ENCOUNTER — Inpatient Hospital Stay (HOSPITAL_COMMUNITY)
Admission: AD | Admit: 2017-10-08 | Discharge: 2017-10-08 | Disposition: A | Discharge status: Home / Self Care | Payer: 59 | Source: Ambulatory Visit | Attending: Obstetrics and Gynecology | Admitting: Obstetrics and Gynecology

## 2017-10-08 ENCOUNTER — Encounter (HOSPITAL_COMMUNITY): Payer: Self-pay

## 2017-10-08 DIAGNOSIS — B373 Candidiasis of vulva and vagina: Secondary | ICD-10-CM | POA: Diagnosis not present

## 2017-10-08 DIAGNOSIS — B3731 Acute candidiasis of vulva and vagina: Secondary | ICD-10-CM

## 2017-10-08 DIAGNOSIS — F17210 Nicotine dependence, cigarettes, uncomplicated: Secondary | ICD-10-CM | POA: Insufficient documentation

## 2017-10-08 DIAGNOSIS — Z113 Encounter for screening for infections with a predominantly sexual mode of transmission: Secondary | ICD-10-CM | POA: Insufficient documentation

## 2017-10-08 DIAGNOSIS — N898 Other specified noninflammatory disorders of vagina: Secondary | ICD-10-CM | POA: Diagnosis present

## 2017-10-08 LAB — URINALYSIS, ROUTINE W REFLEX MICROSCOPIC
BILIRUBIN URINE: NEGATIVE
GLUCOSE, UA: NEGATIVE mg/dL
Hgb urine dipstick: NEGATIVE
KETONES UR: NEGATIVE mg/dL
Nitrite: NEGATIVE
PH: 5 (ref 5.0–8.0)
Protein, ur: NEGATIVE mg/dL
SPECIFIC GRAVITY, URINE: 1.024 (ref 1.005–1.030)

## 2017-10-08 LAB — WET PREP, GENITAL
Clue Cells Wet Prep HPF POC: NEGATIVE — AB
SPERM: NONE SEEN
TRICH WET PREP: NONE SEEN
Yeast Wet Prep HPF POC: NONE SEEN

## 2017-10-08 LAB — POCT PREGNANCY, URINE: Preg Test, Ur: NEGATIVE

## 2017-10-08 MED ORDER — FLUCONAZOLE 150 MG PO TABS: 150.0000 mg | ORAL_TABLET | ORAL | 1 refills | Status: DC | PRN

## 2017-10-08 NOTE — MAU Note (Signed)
Tammy Gardner is a 27 y.o.  here in MAU reporting:  +vaginal itching Denies discharge  States she would like to be checked for Herpes. Patient states she has a bump that has not gone away and would like the peace of mind. LMP: 6/16 Pain score: none Vitals:   10/08/17 1704  BP: (!) 107/58  Pulse: 67  Resp: 16  Temp: 98.3 F (36.8 C)  SpO2: 98%      Lab orders placed from triage: ua and pregnancy test

## 2017-10-08 NOTE — MAU Provider Note (Signed)
History     CSN: [id number]  Arrival date and time: 10/08/17 1641   First Provider Initiated Contact with Patient 10/08/17 1748      Chief Complaint  Patient presents with  . Vaginal Itching   HPI  Tammy Gardner is a 27 y.o. female who presents with vaginal discharge and irritation. Reports clumpy white discharge and vaginal itching x 2 days. Has also noticed an itchy bump on her labia for the last 2 month. Bump is not painful. She is sexually active with 1 partner x 1 year. Does not consistently use condoms. Her partner has HSV. They do not have IC during outbreaks and he is on suppressive therapy. Patient concerned that she has herpes and wants to be tested for STDs.   Past Medical History:  Diagnosis Date  . Bacterial vaginosis   . Chlamydia   . Trichomonas     Past Surgical History:  Procedure Laterality Date  . INDUCED ABORTION      Family History  Problem Relation Age of Onset  . Hypertension Mother     Social History   Tobacco Use  . Smoking status: Current Some Day Smoker    Packs/day: 0.25    Years: 1.00    Pack years: 0.25    Types: Cigarettes  . Smokeless tobacco: Never Used  Substance Use Topics  . Alcohol use: No    Comment: no use since beginning of pregnancy  . Drug use: No    Types: Marijuana    Comment: no use since beginning of pregnancy    Allergies: No Known Allergies  Medications Prior to Admission  Medication Sig Dispense Refill Last Dose  . ibuprofen (ADVIL,MOTRIN) 800 MG tablet Take 1 tablet (800 mg total) by mouth 3 (three) times daily. 21 tablet 0     Review of Systems  Constitutional: Negative.   Gastrointestinal: Negative.   Genitourinary: Positive for genital sores and vaginal discharge. Negative for dyspareunia, dysuria, vaginal bleeding and vaginal pain.   Physical Exam   Blood pressure (!) 107/58, pulse 67, temperature 98.3 F (36.8 C), temperature source Oral, resp. rate 16, weight 106 lb (48.1 kg), last menstrual  period 09/18/2017, SpO2 98 %, currently breastfeeding.  Physical Exam  Nursing note and vitals reviewed. Constitutional: She is oriented to person, place, and time. She appears well-developed and well-nourished. No distress.  HENT:  Head: Normocephalic and atraumatic.  Eyes: Conjunctivae are normal. Right eye exhibits no discharge. Left eye exhibits no discharge. No scleral icterus.  Neck: Normal range of motion.  Respiratory: Effort normal. No respiratory distress.  Genitourinary: There is no tenderness or lesion on the right labia. There is no tenderness or lesion on the left labia. Cervix exhibits no motion tenderness and no friability. There is erythema in the vagina. Vaginal discharge (small amount of clumpy white discharge adherent to vaginal walls) found.  Genitourinary Comments: No lumps or lesions found on vulva  Neurological: She is alert and oriented to person, place, and time.  Skin: Skin is warm and dry. She is not diaphoretic.  Psychiatric: She has a normal mood and affect. Her behavior is normal. Judgment and thought content normal.    MAU Course  Procedures Results for orders placed or performed during the hospital encounter of 10/08/17 (from the past 24 hour(s))  Urinalysis, Routine w reflex microscopic     Status: Abnormal   Collection Time: 10/08/17  5:09 PM  Result Value Ref Range   Color, Urine YELLOW YELLOW   APPearance  HAZY (A) CLEAR   Specific Gravity, Urine 1.024 1.005 - 1.030   pH 5.0 5.0 - 8.0   Glucose, UA NEGATIVE NEGATIVE mg/dL   Hgb urine dipstick NEGATIVE NEGATIVE   Bilirubin Urine NEGATIVE NEGATIVE   Ketones, ur NEGATIVE NEGATIVE mg/dL   Protein, ur NEGATIVE NEGATIVE mg/dL   Nitrite NEGATIVE NEGATIVE   Leukocytes, UA TRACE (A) NEGATIVE   RBC / HPF 6-10 0 - 5 RBC/hpf   WBC, UA 0-5 0 - 5 WBC/hpf   Bacteria, UA RARE (A) NONE SEEN   Squamous Epithelial / LPF 6-10 0 - 5   Mucus PRESENT   Pregnancy, urine POC     Status: None   Collection Time:  10/08/17  5:33 PM  Result Value Ref Range   Preg Test, Ur NEGATIVE NEGATIVE  Wet prep, genital     Status: Abnormal   Collection Time: 10/08/17  6:32 PM  Result Value Ref Range   Yeast Wet Prep HPF POC NONE SEEN NONE SEEN   Trich, Wet Prep NONE SEEN NONE SEEN   Clue Cells Wet Prep HPF POC NEGATIVE (A) NONE SEEN   WBC, Wet Prep HPF POC FEW (A) NONE SEEN   Sperm NONE SEEN     MDM UPT negative HIV, RPR, HSV, GC/CT, & wet prep Discussed blood testing for HSV; pt insistent on having blood test for HSV. No lesion to culture.  Wet prep negative -- will tx for yeast based on exam  Assessment and Plan  A; 1. Vaginal yeast infection   2. Screen for STD (sexually transmitted disease)    P: Discharge home Rx diflucan F/u with gyn STD labs pending Discussed reasons to return to MAU  Judeth HornErin Markeia Harkless 10/08/2017, 5:49 PM

## 2017-10-08 NOTE — Progress Notes (Addendum)
G2P1 nonpregn. States not sexually active. Last protective intercourse was 30 days ago. Presents to triage bc pt thinks she has  Herpes infection. States has several bumps on the vaginal area that is itchy. Noticed it 2 months ago.   OB appt scheduled 29 July appt and unsure of the practice.   Denies vaginal bleeding.   Had vaginal white discharge 2 days ago but not today. Initially thought it was yeast infection. No vaginal discharge noted last 2 days.   BP (!) 107/58 (BP Location: Right Arm)   Pulse 67   Temp 98.3 F (36.8 C) (Oral)   Resp 16   Wt 106 lb (48.1 kg)   LMP 09/18/2017   SpO2 98% Comment: ra  BMI 20.70 kg/m    1833: orders for labs, GC, and wet prep Provider at bs assessing pt.   1842 Phlebotomy called to unit inquiring if pt in room. Informed pt in room  1845: lab at bs  1916: d/c orders received  D/c instructions given with pt understanding. Pt left unit via ambulatory.

## 2017-10-08 NOTE — Discharge Instructions (Signed)

## 2017-10-09 LAB — HIV ANTIBODY (ROUTINE TESTING W REFLEX): HIV SCREEN 4TH GENERATION: NONREACTIVE

## 2017-10-09 LAB — RPR: RPR Ser Ql: NONREACTIVE

## 2017-10-09 LAB — HSV(HERPES SIMPLEX VRS) I + II AB-IGG: HSV 1 GLYCOPROTEIN G AB, IGG: 6.02 {index} — AB (ref 0.00–0.90)

## 2017-10-10 ENCOUNTER — Telehealth: Payer: Self-pay | Admitting: Certified Nurse Midwife

## 2017-10-10 DIAGNOSIS — B009 Herpesviral infection, unspecified: Secondary | ICD-10-CM | POA: Insufficient documentation

## 2017-10-10 LAB — GC/CHLAMYDIA PROBE AMP (~~LOC~~) NOT AT ARMC
CHLAMYDIA, DNA PROBE: NEGATIVE
Neisseria Gonorrhea: NEGATIVE

## 2017-10-10 MED ORDER — VALACYCLOVIR HCL 1 G PO TABS: 1000.0000 mg | ORAL_TABLET | Freq: Two times a day (BID) | ORAL | 0 refills | Status: DC

## 2017-10-10 NOTE — Telephone Encounter (Signed)
Patient called to MAU to get questions answered from MyChart results. 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 Patient reports continued vaginal itching with labial bump that she presents to MAU for on 10/08/2017. Pharmacy and allergies verified with patient. Rx sent for initial primary outbreak of HSV. Answered patient's additional questions with protection in the future. Discussed she can take suppression therapy, notify partners, use protection and not have IC during outbreaks. Patient verbalizes understanding.   Sharyon CableVeronica C Gor Gardner, CNM 10/10/17, 12:32 PM

## 2017-10-11 ENCOUNTER — Ambulatory Visit (HOSPITAL_COMMUNITY)
Admission: EM | Admit: 2017-10-11 | Discharge: 2017-10-11 | Disposition: A | Discharge status: Home / Self Care | Payer: 59 | Attending: Family Medicine | Admitting: Family Medicine

## 2017-10-11 ENCOUNTER — Encounter (HOSPITAL_COMMUNITY): Payer: Self-pay | Admitting: Emergency Medicine

## 2017-10-11 DIAGNOSIS — Z8619 Personal history of other infectious and parasitic diseases: Secondary | ICD-10-CM | POA: Insufficient documentation

## 2017-10-11 DIAGNOSIS — R531 Weakness: Secondary | ICD-10-CM | POA: Diagnosis present

## 2017-10-11 DIAGNOSIS — F17210 Nicotine dependence, cigarettes, uncomplicated: Secondary | ICD-10-CM | POA: Diagnosis not present

## 2017-10-11 DIAGNOSIS — B349 Viral infection, unspecified: Secondary | ICD-10-CM | POA: Insufficient documentation

## 2017-10-11 DIAGNOSIS — R52 Pain, unspecified: Secondary | ICD-10-CM | POA: Insufficient documentation

## 2017-10-11 DIAGNOSIS — M791 Myalgia, unspecified site: Secondary | ICD-10-CM

## 2017-10-11 DIAGNOSIS — R509 Fever, unspecified: Secondary | ICD-10-CM

## 2017-10-11 LAB — HSV(HERPES SIMPLEX VRS) I + II AB-IGM: HSVI/II COMB AB IGM: 1.37 ratio — AB (ref 0.00–0.90)

## 2017-10-11 LAB — POCT RAPID STREP A: Streptococcus, Group A Screen (Direct): NEGATIVE

## 2017-10-11 MED ORDER — ACETAMINOPHEN 325 MG PO TABS
650.0000 mg | ORAL_TABLET | Freq: Once | ORAL | Status: AC
  Administered 2017-10-11: 650 mg via ORAL

## 2017-10-11 MED ORDER — ACETAMINOPHEN 325 MG PO TABS
ORAL_TABLET | ORAL | Status: AC
  Filled 2017-10-11: qty 2

## 2017-10-11 NOTE — Discharge Instructions (Addendum)
Rapid strep negative. Symptoms are most likely due to viral illness/ drainage down your throat. Zyrtec for nasal congestion/drainage. You can use over the counter nasal saline rinse such as neti pot for nasal congestion. Monitor for rashes to the hand, eye redness, joint pain, follow up for reevaluation. Monitor for any worsening of symptoms, swelling of the throat, trouble breathing, trouble swallowing, leaning forward to breath, drooling, go to the emergency department for further evaluation needed.  For sore throat/cough try using a honey-based tea. Use 3 teaspoons of honey with juice squeezed from half lemon. Place shaved pieces of ginger into 1/2-1 cup of water and warm over stove top. Then mix the ingredients and repeat every 4 hours as needed.

## 2017-10-11 NOTE — ED Provider Notes (Signed)
[facility]    CSN: [id number] Arrival date & time: 10/11/17  1478     History   Chief Complaint Chief Complaint  Patient presents with  . Fever    HPI Tammy Gardner is a 27 y.o. female.   27 year old female comes in for 2 to 3-day history of fever, generalized body aches, weakness.  States yesterday started having some sore throat and feels that her nose "wants to run" but has not had any nasal congestion, rhinorrhea.  Denies cough.  T-max 100.4 has not taken anything for it.  States she has had a loss of appetite, states with one episode of nonbilious, nonbloody vomit. Denies abdominal pain. Denies tick bites, rashes. Denies increase in activity. No sick contact. Current every day smoker, 1 black and mile/day.      Past Medical History:  Diagnosis Date  . Bacterial vaginosis   . Chlamydia   . Trichomonas     Patient Active Problem List   Diagnosis Date Noted  . 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 Spontaneous vaginal delivery 12/01/2015  . Encounter for supervision of other normal pregnancy in third trimester 11/26/2015    Past Surgical History:  Procedure Laterality Date  . INDUCED ABORTION      OB History    Gravida  2   Para  1   Term  1   Preterm  0   AB  1   Living  1     SAB  0   TAB  1   Ectopic  0   Multiple  0   Live Births  1            Home Medications    Prior to Admission medications   Medication Sig Start Date End Date Taking? Authorizing Provider  fluconazole (DIFLUCAN) 150 MG tablet Take 1 tablet (150 mg total) by mouth every three (3) days as needed. 10/08/17   Judeth Horn, NP  ibuprofen (ADVIL,MOTRIN) 800 MG tablet Take 1 tablet (800 mg total) by mouth 3 (three) times daily. 09/20/17   Georgetta Haber, NP  valACYclovir (VALTREX) 1000 MG tablet Take 1 tablet (1,000 mg total) by mouth 2 (two) times daily. 10/10/17   [name], CNM    Family History Family History  Problem  Relation Age of Onset  . Hypertension Mother     Social History Social History   Tobacco Use  . Smoking status: Current Some Day Smoker    Packs/day: 0.25    Years: 1.00    Pack years: 0.25    Types: Cigarettes  . Smokeless tobacco: Never Used  Substance Use Topics  . Alcohol use: No    Comment: no use since beginning of pregnancy  . Drug use: No    Types: Marijuana    Comment: no use since beginning of pregnancy     Allergies   Patient has no known allergies.   Review of Systems Review of Systems  Reason unable to perform ROS: See HPI as above.     Physical Exam Triage Vital Signs ED Triage Vitals [10/11/17 1023]  Enc Vitals Group     BP 107/69     Pulse Rate 75     Resp 18     Temp (!) 100.7 F (38.2 C)     Temp Source Oral     SpO2 100 %     Weight      Height      Head  Circumference      Peak Flow      Pain Score 5     Pain Loc      Pain Edu?      Excl. in GC?    No data found.  Updated Vital Signs BP 107/69 (BP Location: Left Arm)   Pulse 75   Temp (!) 100.7 F (38.2 C) (Oral)   Resp 18   LMP 09/18/2017   SpO2 100%   Physical Exam  Constitutional: She is oriented to person, place, and time. She appears well-developed and well-nourished. No distress.  HENT:  Head: Normocephalic and atraumatic.  Right Ear: Tympanic membrane, external ear and ear canal normal. Tympanic membrane is not erythematous and not bulging.  Left Ear: Tympanic membrane, external ear and ear canal normal. Tympanic membrane is not erythematous and not bulging.  Nose: Nose normal. Right sinus exhibits no maxillary sinus tenderness and no frontal sinus tenderness. Left sinus exhibits no maxillary sinus tenderness and no frontal sinus tenderness.  Mouth/Throat: Uvula is midline and mucous membranes are normal. Posterior oropharyngeal erythema present. No tonsillar exudate.  Eyes: Pupils are equal, round, and reactive to light. Conjunctivae are normal.  Neck: Normal range of  motion. Neck supple.  Cardiovascular: Normal rate, regular rhythm and normal heart sounds. Exam reveals no gallop and no friction rub.  No murmur heard. Pulmonary/Chest: Effort normal and breath sounds normal. No stridor. No respiratory distress. She has no decreased breath sounds. She has no wheezes. She has no rhonchi. She has no rales.  Abdominal: Soft. Bowel sounds are normal. There is no tenderness. There is no rebound and no guarding.  Lymphadenopathy:    She has no cervical adenopathy.  Neurological: She is alert and oriented to person, place, and time. She has normal strength. She is not disoriented. No cranial nerve deficit or sensory deficit. Coordination and gait normal. GCS eye subscore is 4. GCS verbal subscore is 5. GCS motor subscore is 6.  Patient able to ambulate on and off exam table without difficulty or hesitancy.   Skin: Skin is warm and dry.  Psychiatric: She has a normal mood and affect. Her behavior is normal. Judgment normal.     UC Treatments / Results  Labs (all labs ordered are listed, but only abnormal results are displayed) Labs Reviewed  CULTURE, GROUP A STREP [facility]  POCT RAPID STREP A    EKG None  Radiology No results found.  Procedures Procedures (including critical care time)  Medications Ordered in UC Medications  acetaminophen (TYLENOL) tablet 650 mg (650 mg Oral Given 10/11/17 1030)    Initial Impression / Assessment and Plan / UC Course  I have reviewed the triage vital signs and the nursing notes.  Pertinent labs & imaging results that were available during my care of the patient were reviewed by me and considered in my medical decision making (see chart for details).    Rapid strep negative.  Lungs clear to auscultation bilaterally without adventitious lung sounds.  Discussed possible viral illness causing symptoms currently.  Tylenol/Motrin for fever and pain.  Push fluids.  Return precautions given.  Patient expresses understanding  and agrees to plan.  Final Clinical Impressions(s) / UC Diagnoses   Final diagnoses:  Viral illness    ED Prescriptions    None        Belinda FisherYu, Keierra Nudo V, PA-C 10/11/17 1058

## 2017-10-11 NOTE — ED Triage Notes (Signed)
Pt sts fever and body aches

## 2017-10-13 LAB — CULTURE, GROUP A STREP (THRC)

## 2018-01-06 ENCOUNTER — Ambulatory Visit (INDEPENDENT_AMBULATORY_CARE_PROVIDER_SITE_OTHER): Payer: 59 | Admitting: Obstetrics

## 2018-01-06 ENCOUNTER — Encounter: Payer: Self-pay | Admitting: Obstetrics

## 2018-01-06 ENCOUNTER — Other Ambulatory Visit (HOSPITAL_COMMUNITY)
Admission: RE | Admit: 2018-01-06 | Discharge: 2018-01-06 | Disposition: A | Discharge status: Home / Self Care | Payer: 59 | Source: Ambulatory Visit | Attending: Obstetrics | Admitting: Obstetrics

## 2018-01-06 VITALS — BP 104/67 | HR 76 | Ht 60.0 in | Wt 108.8 lb

## 2018-01-06 DIAGNOSIS — Z124 Encounter for screening for malignant neoplasm of cervix: Secondary | ICD-10-CM

## 2018-01-06 DIAGNOSIS — Z01419 Encounter for gynecological examination (general) (routine) without abnormal findings: Secondary | ICD-10-CM | POA: Diagnosis not present

## 2018-01-06 DIAGNOSIS — A630 Anogenital (venereal) warts: Secondary | ICD-10-CM

## 2018-01-06 NOTE — Progress Notes (Signed)
Patient presents for Annual Exam today.  LMP: 01/02/18 Contraception: None Not sexually active at this time STD Screening: Done already on 10/08/2017 All were Negative.    C/O: vaginal bump x for months now that will not go away.  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

## 2018-01-06 NOTE — Patient Instructions (Signed)
Human Papillomavirus Human papillomavirus (HPV) is the most common sexually transmitted infection (STI). It is easy to pass it from person to person (contagious). HPV can cause cervical cancer, anal cancer, and genital warts. The genital warts can be seen and felt. Also, there may be wartlike regions in the throat. HPV may not have any symptoms. It is possible to have HPV for a long time and not know it. You may pass HPV on to others without knowing it. Follow these instructions at home:  Take medicines as told by your doctor.  Use over-the-counter creams for itching as told by your doctor.  Keep all follow-up visits. Make sure to get Pap tests as told by your doctor.  Do not touch or scratch the warts.  Do not treat genital warts with medicines used for treating hand warts.  Do not have sex while you are getting treatment.  Do not douche or use tampons during treatment of HPV.  Tell your sex partner about your infection because he or she may also need treatment.  If you get pregnant, tell your doctor that you had HPV. Your doctor will watch your pregnancy closely. This is important to keep your baby safe.  After treatment, use condoms during sex to prevent future infections.  Have only one sex partner.  Have a sex partner who does not have other sex partners. Contact a doctor if:  The treated skin is red, swollen, or painful.  You have a fever.  You feel ill.  You feel lumps or pimple-like areas in and around your genital area.  You have bleeding of the vagina or the area that was treated.  You have pain during sex. This information is not intended to replace advice given to you by your health care provider. Make sure you discuss any questions you have with your health care provider. Document Released: 03/04/2008 Document Revised: 08/28/2015 Document Reviewed: 06/27/2013 Elsevier Interactive Patient Education  2017 Else[employer] Inc[employer]  Genital Warts Genital warts are a common  STD (sexually transmitted disease). They may appear as small bumps on the tissues of the genital area or anal area. Sometimes, they can become irritated and cause pain. Genital warts are easily passed to other people through sexual contact. Getting treatment is important because genital warts can lead to other problems. In females, the virus that causes genital warts may increase the risk of cervical cancer. What are the causes? Genital warts are caused by a virus that is called human papillomavirus (HPV). HPV is spread by having unprotected sex with an infected person. It can be spread through vaginal, anal, and oral sex. Many people do not know that they are infected. They may be infected for years without problems. However, even if they do not have problems, they can pass the infection to their sexual partners. What increases the risk? Genital warts are more likely to develop in:  People who have unprotected sex.  People who have multiple sexual partners.  People who become sexually active before they are 26 years of age.  Men who are not circumcised.  Women who have a female sexual partner who is not circumcised.  People who have a weakened body defense system (immune system) due to disease or medicine.  People who smoke.  What are the signs or symptoms? Symptoms of genital warts include:  Small growths in the genital area or anal area. These warts often grow in clusters.  Itching and irritation in the genital area or anal area.  Bleeding from  the warts.  Painful sexual intercourse.  How is this diagnosed? Genital warts can usually be diagnosed from their appearance on the vagina, vulva, penis, perineum, anus, or rectum. Tests may also be done, such as:  Biopsy. A tissue sample is removed so it can be looked at under a microscope.  Colposcopy. In females, a magnifying tool is used to examine the vagina and cervix. Certain solutions may be used to make the HPV cells change color  so they can be seen more easily.  A Pap test in females.  Tests for other STDs.  How is this treated? Treatment for genital warts may include:  Applying prescription medicines to the warts. These may be solutions or creams.  Freezing the warts with liquid nitrogen (cryotherapy).  Burning the warts with: ? Laser treatment. ? An electrified probe (electrocautery).  Injecting a substance (Candida antigen or Trichophyton antigen) into the warts to help the body's immune system to fight off the warts.  Interferon injections.  Surgery to remove the warts.  Follow these instructions at home: Medicines  Apply over-the-counter and prescription medicines only as told by your health care provider.  Do not treat genital warts with medicines that are used for treating hand warts.  Talk with your health care provider about using over-the-counter anti-itch creams. General instructions  Do not touch or scratch the warts.  Do not have sex until your treatment has been completed.  Tell your current and past sexual partners about your condition because they may also need treatment.  Keep all follow-up visits as told by your health care provider. This is important.  After treatment, use condoms during sex to prevent future infections. Other Instructions for Women  Women who have genital warts might need increased screening for cervical cancer. This type of cancer is slow growing and can be cured if it is found early. Chances of developing cervical cancer are increased with HPV.  If you become pregnant, tell your health care provider that you have had HPV. Your health care provider will monitor you closely during pregnancy to be sure that your baby is safe. How is this prevented? Talk with your health care provider about getting the HPV vaccines. These vaccines prevent some HPV infections and cancers. It is recommended that the vaccine be given to males and females who are 9-26 years of  age. It will not work if you already have HPV, and it is not recommended for pregnant women. Contact a health care provider if:  You have redness, swelling, or pain in the area of the treated skin.  You have a fever.  You feel generally ill.  You feel lumps in and around your genital area or anal area.  You have bleeding in your genital area or anal area.  You have pain during sexual intercourse. This information is not intended to replace advice given to you by your health care provider. Make sure you discuss any questions you have with your health care provider. Document Released: 03/19/2000 Document Revised: 08/28/2015 Document Reviewed: 06/17/2014 Elsevier Interactive Patient Education  [employer].

## 2018-01-06 NOTE — Progress Notes (Signed)
Subjective:        Tammy Gardner is a 27 y.o. female here for a routine exam.  Current complaints: " Bump " on vagina that won't go away..    Personal health questionnaire:  Is patient [name], have a family history of breast and/or ovarian cancer: no Is there a family history of uterine cancer diagnosed at age < 71, gastrointestinal cancer, urinary tract cancer, family member who is a Personnel officer syndrome-associated carrier: no Is the patient overweight and hypertensive, family history of diabetes, personal history of gestational diabetes, preeclampsia or PCOS: no Is patient over 48, have PCOS,  family history of premature CHD under age 46, diabetes, smoke, have hypertension or peripheral artery disease:  no At any time, has a partner hit, kicked or otherwise hurt or frightened you?: no Over the past 2 weeks, have you felt down, depressed or hopeless?: no Over the past 2 weeks, have you felt little interest or pleasure in doing things?:no   Gynecologic History Patient's last menstrual period was 01/02/2018 (exact date). Contraception: abstinence Last Pap: unknown. Results were: abnormal Last mammogram: n/a. Results were: n/a  Obstetric History OB History  Gravida Para Term Preterm AB Living  2 1 1  0 1 1  SAB TAB Ectopic Multiple Live Births  0 1 0 0 1    # Outcome Date GA Lbr Len/2nd Weight Sex Delivery Anes PTL Lv  2 Term 11/29/15 [redacted]w[redacted]d 10:36 / 00:26 5 lb 7.5 oz (2.48 kg) F Vag-Spont EPI  LIV     Birth Comments: none  1 TAB 2014            Past Medical History:  Diagnosis Date  . Bacterial vaginosis   . Chlamydia   . Trichomonas     Past Surgical History:  Procedure Laterality Date  . INDUCED ABORTION       Current Outpatient Medications:  .  fluconazole (DIFLUCAN) 150 MG tablet, Take 1 tablet (150 mg total) by mouth every three (3) days as needed., Disp: 2 tablet, Rfl: 1 .  ibuprofen (ADVIL,MOTRIN) 800 MG tablet, Take 1 tablet (800 mg total) by mouth 3  (three) times daily., Disp: 21 tablet, Rfl: 0 .  valACYclovir (VALTREX) 1000 MG tablet, Take 1 tablet (1,000 mg total) by mouth 2 (two) times daily., Disp: 20 tablet, Rfl: 0 No Known Allergies  Social History   Tobacco Use  . Smoking status: Current Some Day Smoker    Packs/day: 0.25    Years: 1.00    Pack years: 0.25    Types: Cigarettes  . Smokeless tobacco: Never Used  Substance Use Topics  . Alcohol use: No    Family History  Problem Relation Age of Onset  . Hypertension Mother       Review of Systems  Constitutional: negative for fatigue and weight loss Respiratory: negative for cough and wheezing Cardiovascular: negative for chest pain, fatigue and palpitations Gastrointestinal: negative for abdominal pain and change in bowel habits Musculoskeletal:negative for myalgias Neurological: negative for gait problems and tremors Behavioral/Psych: negative for abusive relationship, depression Endocrine: negative for temperature intolerance    Genitourinary:positive for bump on labia - left Integument/breast: negative for breast lump, breast tenderness, nipple discharge and skin lesion(s)    Objective:       BP 104/67   Pulse 76   Ht 5' (1.524 m)   Wt 108 lb 12.8 oz (49.4 kg)   LMP 01/02/2018 (Exact Date)   BMI 21.25 kg/m  General:  alert  Skin:   no rash or abnormalities  Lungs:   clear to auscultation bilaterally  Heart:   regular rate and rhythm, S1, S2 normal, no murmur, click, rub or gallop  Breasts:   normal without suspicious masses, skin or nipple changes or axillary nodes  Abdomen:  normal findings: no organomegaly, soft, non-tender and no hernia  Pelvis:  External genitalia: flat condyloma upper left labia minora Urinary system: urethral meatus normal and bladder without fullness, nontender Vaginal: normal without tenderness, induration or masses Cervix: normal appearance Adnexa: normal bimanual exam Uterus: anteverted and non-tender, normal size    Lab Review Urine pregnancy test Labs reviewed yes Radiologic studies reviewed no  50% of 20 min visit spent on counseling and coordination of care.   Assessment:     1. Encounter for annual routine gynecological examination  2. Screening for cervical cancer Rx: - Cytology - PAP  3. 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 Contraception: abstinence. Follow up in: 1 year.     Brock Bad MD 01-06-2018

## 2018-01-10 LAB — CYTOLOGY - PAP: Diagnosis: NEGATIVE

## 2018-01-27 ENCOUNTER — Ambulatory Visit (INDEPENDENT_AMBULATORY_CARE_PROVIDER_SITE_OTHER): Payer: 59 | Admitting: Obstetrics

## 2018-01-27 ENCOUNTER — Encounter: Payer: Self-pay | Admitting: Obstetrics

## 2018-01-27 VITALS — BP 115/80 | HR 69 | Ht 60.0 in | Wt 110.0 lb

## 2018-01-27 DIAGNOSIS — N9089 Other specified noninflammatory disorders of vulva and perineum: Secondary | ICD-10-CM | POA: Diagnosis not present

## 2018-01-27 NOTE — Progress Notes (Signed)
Patient ID: Tammy Gardner, female   DOB: Jun 14, 1990, 27 y.o.   MRN: [id number]  Chief Complaint  Patient presents with  . Gynecologic Exam    STD Check    HPI Tammy Gardner is a 27 y.o. female.  Patient is concerned about an area on the vulva that itches, and she thinks that it may be a wart. HPI  Past Medical History:  Diagnosis Date  . Bacterial vaginosis   . Chlamydia   . Trichomonas     Past Surgical History:  Procedure Laterality Date  . INDUCED ABORTION      Family History  Problem Relation Age of Onset  . Hypertension Mother     Social History Social History   Tobacco Use  . Smoking status: Current Some Day Smoker    Packs/day: 0.25    Years: 1.00    Pack years: 0.25    Types: Cigarettes  . Smokeless tobacco: Never Used  Substance Use Topics  . Alcohol use: No  . Drug use: No    Types: Marijuana    Comment: no use since beginning of pregnancy    No Known Allergies  Current Outpatient Medications  Medication Sig Dispense Refill  . fluconazole (DIFLUCAN) 150 MG tablet Take 1 tablet (150 mg total) by mouth every three (3) days as needed. (Patient not taking: Reported on 01/27/2018) 2 tablet 1  . ibuprofen (ADVIL,MOTRIN) 800 MG tablet Take 1 tablet (800 mg total) by mouth 3 (three) times daily. (Patient not taking: Reported on 01/27/2018) 21 tablet 0  . valACYclovir (VALTREX) 1000 MG tablet Take 1 tablet (1,000 mg total) by mouth 2 (two) times daily. (Patient not taking: Reported on 01/27/2018) 20 tablet 0   No current facility-administered medications for this visit.     Review of Systems Review of Systems Constitutional: negative for fatigue and weight loss Respiratory: negative for cough and wheezing Cardiovascular: negative for chest pain, fatigue and palpitations Gastrointestinal: negative for abdominal pain and change in bowel habits Genitourinary:positive for pruritic area on vulva Integument/breast: negative for nipple  discharge Musculoskeletal:negative for myalgias Neurological: negative for gait problems and tremors Behavioral/Psych: negative for abusive relationship, depression Endocrine: negative for temperature intolerance      Blood pressure 115/80, pulse 69, height 5' (1.524 m), weight 110 lb (49.9 kg), last menstrual period 01/02/2018, currently breastfeeding.  Physical Exam Physical Exam           General:  Alert and no distress Abdomen:  normal findings: no organomegaly, soft, non-tender and no hernia  Pelvis:  External genitalia: normal general appearance Urinary system: urethral meatus normal and bladder without fullness, nontender Vaginal: normal without tenderness, induration or masses Cervix: normal appearance Adnexa: normal bimanual exam Uterus: anteverted and non-tender, normal size    50% of 15 min visit spent on counseling and coordination of care.   Data Reviewed Wet Prep Cultures  Assessment     1. Lesion of vulva Rx: - Ambulatory referral to Gynecology - Second Opinion Requested    Plan    Follow up prn  Orders Placed This Encounter  Procedures  . Ambulatory referral to Gynecology    Referral Priority:   Routine    Referral Type:   Consultation    Referral Reason:   Specialty Services Required    Requested Specialty:   Gynecology    Number of Visits Requested:   1   No orders of the defined types were placed in this encounter.   Brock Bad MD  01-27-2018        

## 2018-01-27 NOTE — Progress Notes (Signed)
Presents for bump on labia.  C/o of itching bump x 10 months.  She wants it treated today with Rx.

## 2018-03-06 ENCOUNTER — Telehealth: Payer: Self-pay

## 2018-03-06 NOTE — Telephone Encounter (Signed)
Returned call, pt wants rx for Plan B, advised pt that provider denied rx and [facility] Pt agreed

## 2018-09-25 ENCOUNTER — Other Ambulatory Visit: Payer: Self-pay

## 2018-09-25 ENCOUNTER — Ambulatory Visit (HOSPITAL_COMMUNITY)
Admission: EM | Admit: 2018-09-25 | Discharge: 2018-09-25 | Disposition: A | Discharge status: Home / Self Care | Payer: 59 | Attending: Family Medicine | Admitting: Family Medicine

## 2018-09-25 ENCOUNTER — Encounter (HOSPITAL_COMMUNITY): Payer: Self-pay | Admitting: *Deleted

## 2018-09-25 DIAGNOSIS — Z113 Encounter for screening for infections with a predominantly sexual mode of transmission: Secondary | ICD-10-CM | POA: Diagnosis present

## 2018-09-25 LAB — POCT PREGNANCY, URINE: Preg Test, Ur: NEGATIVE

## 2018-09-25 LAB — POCT URINALYSIS DIP (DEVICE)
Bilirubin Urine: NEGATIVE
Glucose, UA: NEGATIVE mg/dL
Hgb urine dipstick: NEGATIVE
Ketones, ur: NEGATIVE mg/dL
Leukocytes,Ua: NEGATIVE
Nitrite: NEGATIVE
Protein, ur: NEGATIVE mg/dL
Specific Gravity, Urine: >=1.03 (ref 1.005–1.030)
Urobilinogen, UA: 1 mg/dL (ref 0.0–1.0)
pH: 6 (ref 5.0–8.0)

## 2018-09-25 MED ORDER — FLUCONAZOLE 150 MG PO TABS: 150.0000 mg | ORAL_TABLET | Freq: Once | ORAL | 0 refills | Status: AC

## 2018-09-25 NOTE — Discharge Instructions (Signed)
Take 1 tablet of diflucan, may repeat if symptoms persistent and positive for yeast  We are testing you for Gonorrhea, Chlamydia, Trichomonas, Yeast and Bacterial Vaginosis. We will call you if anything is positive and let you know if you require any further treatment. Please inform partners of any positive results.   Please return if symptoms not improving with treatment, development of fever, nausea, vomiting, abdominal pain.

## 2018-09-25 NOTE — ED Triage Notes (Signed)
Pt reports having positive chlamydia approx 1 month ago.  States was supposed to f/u with PCP, but didn't, & she vomited after taking the meds.  Wishes to be checked to see if she's clear, as she's starting to get cervical pain again.  Denies vaginal discharge.

## 2018-09-26 NOTE — ED Provider Notes (Signed)
[facility]    CSN: [id number] Arrival date & time: 09/25/18  1812      History   Chief Complaint Chief Complaint  Patient presents with  . Exposure to STD    HPI Tammy Gardner is a 28 y.o. female no contributing past medical history presenting today for evaluation of STDs.  Patient states that approximately 1 month ago she has a positive for chlamydia.  She was treated with azithromycin, vomited this approximately 30 minutes after taking.  Was treated with doxycycline as alternative for 1 week.  She has not followed up with her PCP to ensure clearance of chlamydia.  She is here today to ensure that this has been cleared as well as to evaluate for some irritation she has noticed.  She denies any discharge.  Denies pelvic pain or abdominal pain.  Denies any rashes or lesions.  Has had some urinary urgency and frequency.  Denies hematuria.  Last menstrual cycle was the end of May.  HPI  Past Medical History:  Diagnosis Date  . Bacterial vaginosis   . Chlamydia   . Trichomonas     Patient Active Problem List   Diagnosis Date Noted  . 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 Spontaneous vaginal delivery 12/01/2015  . Encounter for supervision of other normal pregnancy in third trimester 11/26/2015    Past Surgical History:  Procedure Laterality Date  . INDUCED ABORTION      OB History    Gravida  2   Para  1   Term  1   Preterm  0   AB  1   Living  1     SAB  0   TAB  1   Ectopic  0   Multiple  0   Live Births  1            Home Medications    Prior to Admission medications   Not on File    Family History Family History  Problem Relation Age of Onset  . Hypertension Mother     Social History Social History   Tobacco Use  . Smoking status: Former Smoker    Packs/day: 0.25    Years: 1.00    Pack years: 0.25    Types: Cigarettes  . Smokeless tobacco: Never Used  Substance Use Topics  . Alcohol use: Yes    Comment: occasionally  . Drug use: Yes    Types: Marijuana     Allergies   Patient has no known allergies.   Review of Systems Review of Systems  Constitutional: Negative for fever.  Respiratory: Negative for shortness of breath.   Cardiovascular: Negative for chest pain.  Gastrointestinal: Negative for abdominal pain, diarrhea, nausea and vomiting.  Genitourinary: Negative for dysuria, flank pain, genital sores, hematuria, menstrual problem, vaginal bleeding, vaginal discharge and vaginal pain.  Musculoskeletal: Negative for back pain.  Skin: Negative for rash.  Neurological: Negative for dizziness, light-headedness and headaches.     Physical Exam Triage Vital Signs ED Triage Vitals [09/25/18 1842]  Enc Vitals Group     BP (!) 149/87     Pulse Rate 73     Resp 14     Temp 98.6 F (37 C)     Temp Source Oral     SpO2 98 %     Weight      Height      Head Circumference      Peak Flow  Pain Score 7     Pain Loc      Pain Edu?      Excl. in GC?    No data found.  Updated Vital Signs BP (!) 149/87   Pulse 73   Temp 98.6 F (37 C) (Oral)   Resp 14   LMP 09/01/2018 (Exact Date)   SpO2 98%   Breastfeeding No   Visual Acuity Right Eye Distance:   Left Eye Distance:   Bilateral Distance:    Right Eye Near:   Left Eye Near:    Bilateral Near:     Physical Exam Vitals signs and nursing note reviewed.  Constitutional:      General: She is not in acute distress.    Appearance: She is well-developed.  HENT:     Head: Normocephalic and atraumatic.  Eyes:     Conjunctiva/sclera: Conjunctivae normal.  Neck:     Musculoskeletal: Neck supple.  Cardiovascular:     Rate and Rhythm: Normal rate and regular rhythm.     Heart sounds: No murmur.  Pulmonary:     Effort: Pulmonary effort is normal. No respiratory distress.     Breath sounds: Normal breath sounds.  Abdominal:     Palpations: Abdomen is soft.     Tenderness: There is no abdominal  tenderness.     Comments: Soft, nondistended, nontender to light and deep palpation throughout all 4 quadrants of abdomen  Skin:    General: Skin is warm and dry.  Neurological:     Mental Status: She is alert.      UC Treatments / Results  Labs (all labs ordered are listed, but only abnormal results are displayed) Labs Reviewed  POC URINE PREG, ED  POCT URINALYSIS DIP (DEVICE)  POCT PREGNANCY, URINE  CERVICOVAGINAL ANCILLARY ONLY    EKG None  Radiology No results found.  Procedures Procedures (including critical care time)  Medications Ordered in UC Medications - No data to display  Initial Impression / Assessment and Plan / UC Course  I have reviewed the triage vital signs and the nursing notes.  Pertinent labs & imaging results that were available during my care of the patient were reviewed by me and considered in my medical decision making (see chart for details).     Swab obtained, will send off to confirm clearance of chlamydia as well as also for recheck for gonorrhea and trichomonas.  Patient feels as if she may have a yeast infection, will empirically treat for this today with 1 tablet of Diflucan.  Urine negative. Final Clinical Impressions(s) / UC Diagnoses   Final diagnoses:  Screen for STD (sexually transmitted disease)     Discharge Instructions     Take 1 tablet of diflucan, may repeat if symptoms persistent and positive for yeast  We are testing you for Gonorrhea, Chlamydia, Trichomonas, Yeast and Bacterial Vaginosis. We will call you if anything is positive and let you know if you require any further treatment. Please inform partners of any positive results.   Please return if symptoms not improving with treatment, development of fever, nausea, vomiting, abdominal pain.    ED Prescriptions    Medication Sig Dispense Auth. Provider   fluconazole (DIFLUCAN) 150 MG tablet Take 1 tablet (150 mg total) by mouth once for 1 dose. 2 tablet Brittan Butterbaugh,  Brave Dack C, PA-C     Controlled Substance Prescriptions [place] Controlled Substance Registry consulted? Not Applicable   Lew DawesWieters, Gudrun Axe C, [place] JerseyPA-C 09/26/18 [phone]

## 2018-09-27 LAB — CERVICOVAGINAL ANCILLARY ONLY
Bacterial vaginitis: NEGATIVE
Candida vaginitis: NEGATIVE
Chlamydia: NEGATIVE
Neisseria Gonorrhea: NEGATIVE
Trichomonas: NEGATIVE

## 2019-01-04 ENCOUNTER — Ambulatory Visit (HOSPITAL_COMMUNITY)
Admission: EM | Admit: 2019-01-04 | Discharge: 2019-01-04 | Disposition: A | Discharge status: Home / Self Care | Payer: 59 | Attending: Family Medicine | Admitting: Family Medicine

## 2019-01-04 ENCOUNTER — Other Ambulatory Visit: Payer: Self-pay

## 2019-01-04 ENCOUNTER — Encounter (HOSPITAL_COMMUNITY): Payer: Self-pay

## 2019-01-04 DIAGNOSIS — N898 Other specified noninflammatory disorders of vagina: Secondary | ICD-10-CM | POA: Insufficient documentation

## 2019-01-04 MED ORDER — METRONIDAZOLE 500 MG PO TABS: 500.0000 mg | ORAL_TABLET | Freq: Two times a day (BID) | ORAL | 0 refills | Status: DC

## 2019-01-04 NOTE — ED Provider Notes (Signed)
[name]    CSN: [id number] Arrival date & time: 01/04/19  1045      History   Chief Complaint Chief Complaint  Patient presents with  . Vaginal Discharge    HPI Tammy Gardner is a 28 y.o. female.   Patient is a 28 year old female with past medical history of BV, chlamydia, trichomonas.  She presents today with approximate 1 week of white vaginal discharge without odor.  Symptoms have been constant.  Reporting similar to previous bacterial infections.  Currently sexually active.  Last menstrual period was 12/19/2018.  She would like to be checked for STDs.  No associated abdominal pain, back pain, fever, dysuria, hematuria or urinary frequency.  ROS per HPI      Past Medical History:  Diagnosis Date  . Bacterial vaginosis   . Chlamydia   . Trichomonas     Patient Active Problem List   Diagnosis Date Noted  . 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 Spontaneous vaginal delivery 12/01/2015  . Encounter for supervision of other normal pregnancy in third trimester 11/26/2015    Past Surgical History:  Procedure Laterality Date  . INDUCED ABORTION      OB History    Gravida  2   Para  1   Term  1   Preterm  0   AB  1   Living  1     SAB  0   TAB  1   Ectopic  0   Multiple  0   Live Births  1            Home Medications    Prior to Admission medications   Medication Sig Start Date End Date Taking? Authorizing Provider  metroNIDAZOLE (FLAGYL) 500 MG tablet Take 1 tablet (500 mg total) by mouth 2 (two) times daily. 01/04/19   Orvan July, NP    Family History Family History  Problem Relation Age of Onset  . Hypertension Mother     Social History Social History   Tobacco Use  . Smoking status: Former Smoker    Packs/day: 0.25    Years: 1.00    Pack years: 0.25    Types: Cigarettes  . Smokeless tobacco: Never Used  Substance Use Topics  . Alcohol use: Yes    Comment: occasionally  . Drug use: Yes     Types: Marijuana     Allergies   Patient has no known allergies.   Review of Systems Review of Systems   Physical Exam Triage Vital Signs ED Triage Vitals  Enc Vitals Group     BP 01/04/19 1110 113/67     Pulse Rate 01/04/19 1110 70     Resp --      Temp 01/04/19 1110 98.5 F (36.9 C)     Temp Source 01/04/19 1110 Oral     SpO2 01/04/19 1110 100 %     Weight 01/04/19 1107 118 lb (53.5 kg)     Height --      Head Circumference --      Peak Flow --      Pain Score 01/04/19 1106 0     Pain Loc --      Pain Edu? --      Excl. in [place]? --    No data found.  Updated Vital Signs BP 113/67 (BP Location: Right Arm)   Pulse 70   Temp 98.5 F (36.9 C) (Oral)   Wt 118 lb (53.5 kg)  LMP 12/19/2018   SpO2 100%   BMI 23.05 kg/m   Visual Acuity Right Eye Distance:   Left Eye Distance:   Bilateral Distance:    Right Eye Near:   Left Eye Near:    Bilateral Near:     Physical Exam Vitals signs and nursing note reviewed.  Constitutional:      General: She is not in acute distress.    Appearance: Normal appearance. She is not ill-appearing, toxic-appearing or diaphoretic.  HENT:     Head: Normocephalic.     Nose: Nose normal.     Mouth/Throat:     Pharynx: Oropharynx is clear.  Eyes:     Conjunctiva/sclera: Conjunctivae normal.  Neck:     Musculoskeletal: Normal range of motion.  Pulmonary:     Effort: Pulmonary effort is normal.  Abdominal:     Palpations: Abdomen is soft.     Tenderness: There is no abdominal tenderness.  Musculoskeletal: Normal range of motion.  Skin:    General: Skin is warm and dry.     Findings: No rash.  Neurological:     Mental Status: She is alert.  Psychiatric:        Mood and Affect: Mood normal.      UC Treatments / Results  Labs (all labs ordered are listed, but only abnormal results are displayed) Labs Reviewed  CERVICOVAGINAL ANCILLARY ONLY    EKG   Radiology No results found.  Procedures Procedures  (including critical care time)  Medications Ordered in UC Medications - No data to display  Initial Impression / Assessment and Plan / UC Course  I have reviewed the triage vital signs and the nursing notes.  Pertinent labs & imaging results that were available during my care of the patient were reviewed by me and considered in my medical decision making (see chart for details).     Vaginal discharge- treating for BV based on hx and symptoms.  Sending swab for further testing.  Labs pending.  Final Clinical Impressions(s) / UC Diagnoses   Final diagnoses:  Vaginal discharge     Discharge Instructions     Treating you for BV Your results are pending.  Follow up as needed for continued or worsening symptoms     ED Prescriptions    Medication Sig Dispense Auth. Provider   metroNIDAZOLE (FLAGYL) 500 MG tablet Take 1 tablet (500 mg total) by mouth 2 (two) times daily. 14 tablet Tayelor Osborne A, NP     PDMP not reviewed this encounter.   Dahlia Byes A, NP 01/04/19 1155

## 2019-01-04 NOTE — Discharge Instructions (Signed)
Treating you for BV Your results are pending.  Follow up as needed for continued or worsening symptoms

## 2019-01-04 NOTE — ED Triage Notes (Signed)
Pt states she has a vaginal discharge x 1 week. Pt states it's white in color.

## 2019-01-08 LAB — CERVICOVAGINAL ANCILLARY ONLY
Bacterial Vaginitis (gardnerella): POSITIVE — AB
Candida Glabrata: NEGATIVE
Candida Vaginitis: NEGATIVE
Chlamydia: NEGATIVE
Neisseria Gonorrhea: NEGATIVE
Trichomonas: NEGATIVE

## 2019-02-26 ENCOUNTER — Ambulatory Visit (HOSPITAL_COMMUNITY)
Admission: EM | Admit: 2019-02-26 | Discharge: 2019-02-26 | Disposition: A | Discharge status: Home / Self Care | Payer: Medicaid Other | Attending: Family Medicine | Admitting: Family Medicine

## 2019-02-26 ENCOUNTER — Encounter (HOSPITAL_COMMUNITY): Payer: Self-pay

## 2019-02-26 ENCOUNTER — Other Ambulatory Visit: Payer: Self-pay

## 2019-02-26 DIAGNOSIS — N760 Acute vaginitis: Secondary | ICD-10-CM | POA: Diagnosis not present

## 2019-02-26 MED ORDER — FLUCONAZOLE 150 MG PO TABS: 150.0000 mg | ORAL_TABLET | Freq: Every day | ORAL | 0 refills | Status: DC

## 2019-02-26 MED ORDER — METRONIDAZOLE 500 MG PO TABS: 500.0000 mg | ORAL_TABLET | Freq: Two times a day (BID) | ORAL | 0 refills | Status: DC

## 2019-02-26 NOTE — Discharge Instructions (Addendum)
Consider a women's probiotic daily to prevent infections Take the metronidazole 2 x a day  Take the diflucan as directed We will notify you if any of your tests are positive

## 2019-02-26 NOTE — ED Provider Notes (Addendum)
[place]    CSN: [id number] Arrival date & time: 02/26/19  1559      History   Chief Complaint Chief Complaint  Patient presents with  . Vaginitis    HPI Tammy Gardner is a 28 y.o. female.   HPI  Patient is here with a vaginal infection.  Vaginal itching and discharge.  She also desires STD testing. Uncertain if this is BV or Yeast or both.  She is uncomfortable with itching and desires treatment. No rash or fever.  Declines blood testing for HIV or Syphilis Past Medical History:  Diagnosis Date  . Bacterial vaginosis   . Chlamydia   . Trichomonas     Patient Active Problem List   Diagnosis Date Noted  . 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 Spontaneous vaginal delivery 12/01/2015  . Encounter for supervision of other normal pregnancy in third trimester 11/26/2015    Past Surgical History:  Procedure Laterality Date  . INDUCED ABORTION      OB History    Gravida  2   Para  1   Term  1   Preterm  0   AB  1   Living  1     SAB  0   TAB  1   Ectopic  0   Multiple  0   Live Births  1            Home Medications    Prior to Admission medications   Medication Sig Start Date End Date Taking? Authorizing Provider  fluconazole (DIFLUCAN) 150 MG tablet Take 1 tablet (150 mg total) by mouth daily. Repeat in 1 week if needed 02/26/19   Raylene Everts, MD  metroNIDAZOLE (FLAGYL) 500 MG tablet Take 1 tablet (500 mg total) by mouth 2 (two) times daily. 02/26/19   Raylene Everts, MD    Family History Family History  Problem Relation Age of Onset  . Hypertension Mother     Social History Social History   Tobacco Use  . Smoking status: Former Smoker    Packs/day: 0.25    Years: 1.00    Pack years: 0.25    Types: Cigarettes  . Smokeless tobacco: Never Used  Substance Use Topics  . Alcohol use: Yes    Comment: occasionally  . Drug use: Yes    Types: Marijuana     Allergies   Patient has no  known allergies.   Review of Systems Review of Systems  Constitutional: Negative for chills and fever.  HENT: Negative for ear pain and sore throat.   Eyes: Negative for pain and visual disturbance.  Respiratory: Negative for cough and shortness of breath.   Cardiovascular: Negative for chest pain and palpitations.  Gastrointestinal: Negative for abdominal pain and vomiting.  Genitourinary: Positive for vaginal discharge. Negative for dysuria and hematuria.  Musculoskeletal: Negative for arthralgias and back pain.  Skin: Negative for color change and rash.  Neurological: Negative for seizures and syncope.  All other systems reviewed and are negative.    Physical Exam Triage Vital Signs ED Triage Vitals  Enc Vitals Group     BP 02/26/19 1703 (!) 97/58     Pulse Rate 02/26/19 1703 76     Resp 02/26/19 1703 18     Temp 02/26/19 1703 98.4 F (36.9 C)     Temp Source 02/26/19 1703 Oral     SpO2 02/26/19 1703 100 %     Weight --  Height --      Head Circumference --      Peak Flow --      Pain Score 02/26/19 1704 0     Pain Loc --      Pain Edu? --      Excl. in [place]? --    No data found.  Updated Vital Signs BP (!) 97/58 (BP Location: Left Arm)   Pulse 76   Temp 98.4 F (36.9 C) (Oral)   Resp 18   LMP 02/11/2019   SpO2 100%   Visual Acuity Right Eye Distance:   Left Eye Distance:   Bilateral Distance:    Right Eye Near:   Left Eye Near:    Bilateral Near:     Physical Exam Constitutional:      General: She is not in acute distress.    Appearance: She is well-developed.  HENT:     Head: Normocephalic and atraumatic.  Eyes:     Conjunctiva/sclera: Conjunctivae normal.     Pupils: Pupils are equal, round, and reactive to light.  Neck:     Musculoskeletal: Normal range of motion.  Cardiovascular:     Rate and Rhythm: Normal rate.  Pulmonary:     Effort: Pulmonary effort is normal. No respiratory distress.  Abdominal:     General: There is no  distension.     Palpations: Abdomen is soft.  Musculoskeletal: Normal range of motion.  Skin:    General: Skin is warm and dry.  Neurological:     Mental Status: She is alert.  Psychiatric:        Mood and Affect: Mood normal.        Behavior: Behavior normal.      UC Treatments / Results  Labs (all labs ordered are listed, but only abnormal results are displayed) Labs Reviewed  CERVICOVAGINAL ANCILLARY ONLY    EKG   Radiology No results found.  Procedures Procedures (including critical care time)  Medications Ordered in UC Medications - No data to display  Initial Impression / Assessment and Plan / UC Course  I have reviewed the triage vital signs and the nursing notes.  Pertinent labs & imaging results that were available during my care of the patient were reviewed by me and considered in my medical decision making (see chart for details).      Final Clinical Impressions(s) / UC Diagnoses   Final diagnoses:  Acute vaginitis     Discharge Instructions     Consider a women's probiotic daily to prevent infections Take the metronidazole 2 x a day  Take the diflucan as directed We will notify you if any of your tests are positive   ED Prescriptions    Medication Sig Dispense Auth. Provider   metroNIDAZOLE (FLAGYL) 500 MG tablet Take 1 tablet (500 mg total) by mouth 2 (two) times daily. 14 tablet Eustace Moore, MD   fluconazole (DIFLUCAN) 150 MG tablet Take 1 tablet (150 mg total) by mouth daily. Repeat in 1 week if needed 2 tablet Eustace Moore, MD     PDMP not reviewed this encounter.   Eustace Moore, MD 02/26/19 1755    Eustace Moore, MD 02/26/19 1755

## 2019-02-26 NOTE — ED Triage Notes (Signed)
Pt presents with vaginal irritation X 3 days. 

## 2019-02-27 LAB — CERVICOVAGINAL ANCILLARY ONLY
Chlamydia: NEGATIVE
Neisseria Gonorrhea: NEGATIVE
Trichomonas: NEGATIVE

## 2019-04-09 ENCOUNTER — Ambulatory Visit: Payer: Medicaid Other | Attending: Internal Medicine

## 2019-04-09 DIAGNOSIS — Z20822 Contact with and (suspected) exposure to covid-19: Secondary | ICD-10-CM

## 2019-04-10 LAB — NOVEL CORONAVIRUS, NAA: SARS-CoV-2, NAA: NOT DETECTED

## 2019-05-08 ENCOUNTER — Ambulatory Visit: Payer: Medicaid Other | Attending: Internal Medicine

## 2019-05-08 ENCOUNTER — Ambulatory Visit: Payer: Medicaid Other | Admitting: Obstetrics

## 2019-05-08 DIAGNOSIS — Z20822 Contact with and (suspected) exposure to covid-19: Secondary | ICD-10-CM

## 2019-05-09 LAB — NOVEL CORONAVIRUS, NAA: SARS-CoV-2, NAA: DETECTED — AB

## 2019-05-22 ENCOUNTER — Ambulatory Visit: Payer: Medicaid Other | Admitting: Obstetrics

## 2019-05-30 ENCOUNTER — Ambulatory Visit: Payer: Medicaid Other | Admitting: Obstetrics

## 2019-06-12 ENCOUNTER — Other Ambulatory Visit: Payer: Self-pay

## 2019-06-12 ENCOUNTER — Other Ambulatory Visit (HOSPITAL_COMMUNITY)
Admission: RE | Admit: 2019-06-12 | Discharge: 2019-06-12 | Disposition: A | Discharge status: Home / Self Care | Payer: Medicaid Other | Source: Ambulatory Visit | Attending: Obstetrics | Admitting: Obstetrics

## 2019-06-12 ENCOUNTER — Ambulatory Visit: Payer: Medicaid Other | Admitting: Obstetrics

## 2019-06-12 ENCOUNTER — Encounter: Payer: Self-pay | Admitting: Obstetrics

## 2019-06-12 VITALS — BP 116/68 | HR 72 | Ht 60.0 in | Wt 113.0 lb

## 2019-06-12 DIAGNOSIS — Z0000 Encounter for general adult medical examination without abnormal findings: Secondary | ICD-10-CM

## 2019-06-12 DIAGNOSIS — Z01419 Encounter for gynecological examination (general) (routine) without abnormal findings: Secondary | ICD-10-CM

## 2019-06-12 DIAGNOSIS — F17200 Nicotine dependence, unspecified, uncomplicated: Secondary | ICD-10-CM

## 2019-06-12 DIAGNOSIS — N898 Other specified noninflammatory disorders of vagina: Secondary | ICD-10-CM | POA: Diagnosis present

## 2019-06-12 DIAGNOSIS — Z3009 Encounter for other general counseling and advice on contraception: Secondary | ICD-10-CM

## 2019-06-12 DIAGNOSIS — Z113 Encounter for screening for infections with a predominantly sexual mode of transmission: Secondary | ICD-10-CM

## 2019-06-12 NOTE — Progress Notes (Signed)
Subjective:        Tammy Gardner is a 29 y.o. female here for a routine exam.  Current complaints: None.    Personal health questionnaire:  Is patient [name], have a family history of breast and/or ovarian cancer: no Is there a family history of uterine cancer diagnosed at age < 76, gastrointestinal cancer, urinary tract cancer, family member who is a Personnel officer syndrome-associated carrier: no Is the patient overweight and hypertensive, family history of diabetes, personal history of gestational diabetes, preeclampsia or PCOS: no Is patient over 77, have PCOS,  family history of premature CHD under age 28, diabetes, smoke, have hypertension or peripheral artery disease:  no At any time, has a partner hit, kicked or otherwise hurt or frightened you?: no Over the past 2 weeks, have you felt down, depressed or hopeless?: no Over the past 2 weeks, have you felt little interest or pleasure in doing things?:no   Gynecologic History Patient's last menstrual period was 06/04/2019. Contraception: none Last Pap: 01-06-2018. Results were: normal Last mammogram: n/a. Results were: n/a  Obstetric History OB History  Gravida Para Term Preterm AB Living  2 1 1  0 1 1  SAB TAB Ectopic Multiple Live Births  0 1 0 0 1    # Outcome Date GA Lbr Len/2nd Weight Sex Delivery Anes PTL Lv  2 Term 11/29/15 [redacted]w[redacted]d 10:36 / 00:26 5 lb 7.5 oz (2.48 kg) F Vag-Spont EPI  LIV     Birth Comments: none  1 TAB 2014            Past Medical History:  Diagnosis Date  . Bacterial vaginosis   . Chlamydia   . Trichomonas     Past Surgical History:  Procedure Laterality Date  . INDUCED ABORTION      No current outpatient medications on file. No Known Allergies  Social History   Tobacco Use  . Smoking status: Light Tobacco Smoker    Packs/day: 0.25    Years: 1.00    Pack years: 0.25    Types: Cigarettes  . Smokeless tobacco: Never Used  . Tobacco comment: occ  Substance Use Topics  . Alcohol  use: Yes    Comment: occasionally    Family History  Problem Relation Age of Onset  . Hypertension Mother       Review of Systems  Constitutional: negative for fatigue and weight loss Respiratory: negative for cough and wheezing Cardiovascular: negative for chest pain, fatigue and palpitations Gastrointestinal: negative for abdominal pain and change in bowel habits Musculoskeletal:negative for myalgias Neurological: negative for gait problems and tremors Behavioral/Psych: negative for abusive relationship, depression Endocrine: negative for temperature intolerance    Genitourinary:negative for abnormal menstrual periods, genital lesions, hot flashes, sexual problems and vaginal discharge Integument/breast: negative for breast lump, breast tenderness, nipple discharge and skin lesion(s)    Objective:       BP 116/68   Pulse 72   Ht 5' (1.524 m)   Wt 113 lb (51.3 kg)   LMP 06/04/2019   BMI 22.07 kg/m  General:   alert  Skin:   no rash or abnormalities  Lungs:   clear to auscultation bilaterally  Heart:   regular rate and rhythm, S1, S2 normal, no murmur, click, rub or gallop  Breasts:   normal without suspicious masses, skin or nipple changes or axillary nodes  Abdomen:  normal findings: no organomegaly, soft, non-tender and no hernia  Pelvis:  External genitalia: normal general appearance Urinary system:  urethral meatus normal and bladder without fullness, nontender Vaginal: normal without tenderness, induration or masses Cervix: normal appearance Adnexa: normal bimanual exam Uterus: anteverted and non-tender, normal size   Lab Review Urine pregnancy test Labs reviewed yes Radiologic studies reviewed no  50% of 25 min visit spent on counseling and coordination of care.   Assessment:     1. Encounter for routine gynecological examination with Papanicolaou smear of cervix Rx: - Cytology - PAP( [facility])  2. Vaginal discharge Rx: - Cervicovaginal ancillary  only( [facility])  3. Screen for STD (sexually transmitted disease) Rx: - HIV Antibody (routine testing w rflx) - Hepatitis B surface antigen - RPR - Hepatitis C antibody  4. Encounter for other general counseling and advice on contraception - declines contraception  5. Tobacco dependence - tobacco cessation with the aid of medication and behavioral modification recommended    Plan:    Education reviewed: calcium supplements, depression evaluation, low fat, low cholesterol diet, safe sex/STD prevention, self breast exams, smoking cessation and weight bearing exercise. Contraception: none. Follow up in: 1 year.    Orders Placed This Encounter  Procedures  . HIV Antibody (routine testing w rflx)  . Hepatitis B surface antigen  . RPR  . Hepatitis C antibody    Shelly Bombard, MD 06/12/2019 1:28 PM

## 2019-06-13 LAB — CERVICOVAGINAL ANCILLARY ONLY
Bacterial Vaginitis (gardnerella): NEGATIVE
Candida Glabrata: NEGATIVE
Candida Vaginitis: NEGATIVE
Chlamydia: NEGATIVE
Comment: NEGATIVE
Comment: NEGATIVE
Comment: NEGATIVE
Comment: NEGATIVE
Comment: NEGATIVE
Comment: NORMAL
Neisseria Gonorrhea: NEGATIVE
Trichomonas: NEGATIVE

## 2019-06-13 LAB — RPR: RPR Ser Ql: NONREACTIVE

## 2019-06-13 LAB — CYTOLOGY - PAP: Diagnosis: NEGATIVE

## 2019-06-13 LAB — HIV ANTIBODY (ROUTINE TESTING W REFLEX): HIV Screen 4th Generation wRfx: NONREACTIVE

## 2019-06-13 LAB — HEPATITIS C ANTIBODY: Hep C Virus Ab: <0.1 s/co ratio (ref 0.0–0.9)

## 2019-06-13 LAB — HEPATITIS B SURFACE ANTIGEN: Hepatitis B Surface Ag: NEGATIVE

## 2019-07-25 ENCOUNTER — Ambulatory Visit: Payer: Medicaid Other

## 2019-08-08 ENCOUNTER — Ambulatory Visit (INDEPENDENT_AMBULATORY_CARE_PROVIDER_SITE_OTHER): Payer: Medicaid Other

## 2019-08-08 ENCOUNTER — Other Ambulatory Visit: Payer: Self-pay

## 2019-08-08 ENCOUNTER — Other Ambulatory Visit (HOSPITAL_COMMUNITY)
Admission: RE | Admit: 2019-08-08 | Discharge: 2019-08-08 | Disposition: A | Discharge status: Home / Self Care | Payer: Medicaid Other | Source: Ambulatory Visit | Attending: Obstetrics and Gynecology | Admitting: Obstetrics and Gynecology

## 2019-08-08 VITALS — BP 121/80 | HR 77 | Wt 113.0 lb

## 2019-08-08 DIAGNOSIS — N898 Other specified noninflammatory disorders of vagina: Secondary | ICD-10-CM

## 2019-08-08 NOTE — Progress Notes (Signed)
Patient seen and assessed by nursing staff during this encounter. I have reviewed the chart and agree with the documentation and plan. I have also made any necessary editorial changes.  Winry Egnew, MD 08/08/2019 8:05 PM    

## 2019-08-08 NOTE — Progress Notes (Signed)
SUBJECTIVE:  29 y.o. female complains of white vaginal discharge for 1 week(s). Denies abnormal vaginal bleeding, odor or significant pelvic pain or fever. No UTI symptoms. Denies history of known exposure to STD.  Patient's last menstrual period was 07/23/2019 (exact date).  OBJECTIVE:  She appears well, afebrile. Urine dipstick: not done.  ASSESSMENT:  Vaginal Discharge     PLAN:  GC, chlamydia, trichomonas, BVAG, CVAG probe sent to lab. Treatment: To be determined once lab results are received ROV prn if symptoms persist or worsen.

## 2019-08-09 LAB — CERVICOVAGINAL ANCILLARY ONLY
Bacterial Vaginitis (gardnerella): NEGATIVE
Candida Glabrata: NEGATIVE
Candida Vaginitis: NEGATIVE
Chlamydia: NEGATIVE
Comment: NEGATIVE
Comment: NEGATIVE
Comment: NEGATIVE
Comment: NEGATIVE
Comment: NEGATIVE
Comment: NORMAL
Neisseria Gonorrhea: NEGATIVE
Trichomonas: NEGATIVE

## 2019-11-21 ENCOUNTER — Other Ambulatory Visit (HOSPITAL_COMMUNITY)
Admission: RE | Admit: 2019-11-21 | Discharge: 2019-11-21 | Disposition: A | Discharge status: Home / Self Care | Payer: Medicaid Other | Source: Ambulatory Visit | Attending: Obstetrics | Admitting: Obstetrics

## 2019-11-21 ENCOUNTER — Other Ambulatory Visit: Payer: Self-pay

## 2019-11-21 ENCOUNTER — Ambulatory Visit (INDEPENDENT_AMBULATORY_CARE_PROVIDER_SITE_OTHER): Payer: Medicaid Other | Admitting: *Deleted

## 2019-11-21 DIAGNOSIS — Z113 Encounter for screening for infections with a predominantly sexual mode of transmission: Secondary | ICD-10-CM

## 2019-11-21 NOTE — Progress Notes (Signed)
Pt is in office for STD screening.  Pt denies vaginal discharge, itch, irritation or odor.  Pt states she just wants a check up.  Self swab collected today and will have full panel ran per request.   Pt made aware she will be contacted with results.

## 2019-11-22 LAB — CERVICOVAGINAL ANCILLARY ONLY
Bacterial Vaginitis (gardnerella): NEGATIVE
Candida Glabrata: NEGATIVE
Candida Vaginitis: NEGATIVE
Chlamydia: NEGATIVE
Comment: NEGATIVE
Comment: NEGATIVE
Comment: NEGATIVE
Comment: NEGATIVE
Comment: NEGATIVE
Comment: NORMAL
Neisseria Gonorrhea: NEGATIVE
Trichomonas: NEGATIVE

## 2019-11-22 NOTE — Progress Notes (Signed)
Patient was assessed and managed by nursing staff during this encounter. I have reviewed the chart and agree with the documentation and plan. I have also made any necessary editorial changes.  Warden Fillers, MD 11/22/2019 10:15 AM

## 2020-01-15 ENCOUNTER — Ambulatory Visit: Payer: Medicaid Other | Admitting: Obstetrics

## 2020-01-18 ENCOUNTER — Ambulatory Visit: Payer: Medicaid Other | Admitting: Obstetrics

## 2020-01-27 ENCOUNTER — Encounter (HOSPITAL_COMMUNITY): Payer: Self-pay | Admitting: *Deleted

## 2020-01-27 ENCOUNTER — Other Ambulatory Visit: Payer: Self-pay

## 2020-01-27 ENCOUNTER — Ambulatory Visit (HOSPITAL_COMMUNITY)
Admission: EM | Admit: 2020-01-27 | Discharge: 2020-01-27 | Disposition: A | Discharge status: Home / Self Care | Payer: Medicaid Other | Attending: Emergency Medicine | Admitting: Emergency Medicine

## 2020-01-27 DIAGNOSIS — R8290 Unspecified abnormal findings in urine: Secondary | ICD-10-CM

## 2020-01-27 LAB — POCT URINALYSIS DIPSTICK, ED / UC
Bilirubin Urine: NEGATIVE
Glucose, UA: NEGATIVE mg/dL
Hgb urine dipstick: NEGATIVE
Ketones, ur: NEGATIVE mg/dL
Nitrite: NEGATIVE
Protein, ur: NEGATIVE mg/dL
Specific Gravity, Urine: 1.025 (ref 1.005–1.030)
Urobilinogen, UA: 0.2 mg/dL (ref 0.0–1.0)
pH: 7 (ref 5.0–8.0)

## 2020-01-27 LAB — POC URINE PREG, ED: Preg Test, Ur: NEGATIVE

## 2020-01-27 NOTE — ED Triage Notes (Signed)
Pt reports being 3 days late on period; states recently took Plan B. Also c/o malodorous vaginal discharge x 2 days without abd pain.

## 2020-01-27 NOTE — ED Provider Notes (Signed)
[facility]    CSN: [id number] Arrival date & time: 01/27/20  1638      History   Chief Complaint Chief Complaint  Patient presents with  . Vaginal Discharge  . Possible Pregnancy    HPI Tammy Gardner is a 29 y.o. female.   Patient presents with malodorous urine x2 days.  She also reports she is 3 days late for her menstrual period.  She denies vaginal discharge, pelvic pain, abdominal pain, dysuria, back pain, fever, chills, rash, lesions, or other symptoms.  No treatments attempted at home.     The history is provided by the patient.    Past Medical History:  Diagnosis Date  . Bacterial vaginosis   . Chlamydia   . Trichomonas     Patient Active Problem List   Diagnosis Date Noted  . 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 Spontaneous vaginal delivery 12/01/2015  . Encounter for supervision of other normal pregnancy in third trimester 11/26/2015    Past Surgical History:  Procedure Laterality Date  . INDUCED ABORTION      OB History    Gravida  2   Para  1   Term  1   Preterm  0   AB  1   Living  1     SAB  0   TAB  1   Ectopic  0   Multiple  0   Live Births  1            Home Medications    Prior to Admission medications   Not on File    Family History Family History  Problem Relation Age of Onset  . Hypertension Mother     Social History Social History   Tobacco Use  . Smoking status: Current Every Day Smoker    Packs/day: 0.25    Years: 1.00    Pack years: 0.25  . Smokeless tobacco: Never Used  . Tobacco comment: Black & Milds  Vaping Use  . Vaping Use: Never used  Substance Use Topics  . Alcohol use: Yes    Comment: occasionally  . Drug use: Yes    Types: Marijuana     Allergies   Patient has no known allergies.   Review of Systems Review of Systems  Constitutional: Negative for chills and fever.  HENT: Negative for ear pain and sore throat.   Eyes: Negative for pain and  visual disturbance.  Respiratory: Negative for cough and shortness of breath.   Cardiovascular: Negative for chest pain and palpitations.  Gastrointestinal: Negative for abdominal pain and vomiting.  Genitourinary: Negative for dysuria, flank pain, hematuria, pelvic pain and vaginal discharge.       Malodorous urine.  Musculoskeletal: Negative for arthralgias and back pain.  Skin: Negative for color change and rash.  Neurological: Negative for seizures and syncope.  All other systems reviewed and are negative.    Physical Exam Triage Vital Signs ED Triage Vitals  Enc Vitals Group     BP      Pulse      Resp      Temp      Temp src      SpO2      Weight      Height      Head Circumference      Peak Flow      Pain Score      Pain Loc      Pain Edu?  Excl. in [place]?    No data found.  Updated Vital Signs BP 121/71   Pulse 76   Temp 97.6 F (36.4 C) (Oral)   Resp 16   LMP 12/26/2019 (Exact Date)   SpO2 98%   Visual Acuity Right Eye Distance:   Left Eye Distance:   Bilateral Distance:    Right Eye Near:   Left Eye Near:    Bilateral Near:     Physical Exam Vitals and nursing note reviewed.  Constitutional:      General: She is not in acute distress.    Appearance: She is well-developed. She is not ill-appearing.  HENT:     Head: Normocephalic and atraumatic.     Mouth/Throat:     Mouth: Mucous membranes are moist.  Eyes:     Conjunctiva/sclera: Conjunctivae normal.  Cardiovascular:     Rate and Rhythm: Normal rate and regular rhythm.     Heart sounds: Normal heart sounds.  Pulmonary:     Effort: Pulmonary effort is normal. No respiratory distress.     Breath sounds: Normal breath sounds.  Abdominal:     Palpations: Abdomen is soft.     Tenderness: There is no abdominal tenderness. There is no right CVA tenderness, left CVA tenderness, guarding or rebound.  Musculoskeletal:     Cervical back: Neck supple.  Skin:    General: Skin is warm and dry.      Findings: No rash.  Neurological:     General: No focal deficit present.     Mental Status: She is alert and oriented to person, place, and time.     Gait: Gait normal.  Psychiatric:        Mood and Affect: Mood normal.        Behavior: Behavior normal.      UC Treatments / Results  Labs (all labs ordered are listed, but only abnormal results are displayed) Labs Reviewed  POCT URINALYSIS DIPSTICK, ED / UC - Abnormal; Notable for the following components:      Result Value   Leukocytes,Ua TRACE (*)    All other components within normal limits  URINE CULTURE  POC URINE PREG, ED  CERVICOVAGINAL ANCILLARY ONLY    EKG   Radiology No results found.  Procedures Procedures (including critical care time)  Medications Ordered in UC Medications - No data to display  Initial Impression / Assessment and Plan / UC Course  I have reviewed the triage vital signs and the nursing notes.  Pertinent labs & imaging results that were available during my care of the patient were reviewed by me and considered in my medical decision making (see chart for details).   Malodorous urine.  Urine culture pending.  Patient obtained vaginal self swab for testing.  Discussed with patient that we will call her if any of her test results are positive requiring treatment.  Instructed her to abstain from sexual activity until the test results are back.  Discussed that she and her sexual partner may require treatment at that time.  Patient agrees to plan of care.   Final Clinical Impressions(s) / UC Diagnoses   Final diagnoses:  Malodorous urine     Discharge Instructions     Your urine pregnancy is negative.  A urine culture is pending.  We will call you if it indicates the need for treatment.    Your vaginal tests are pending.  If your test results are positive, we will call you.  You and your sexual  partner(s) may require treatment at that time.  Do not have sexual activity until the test  results are back.         ED Prescriptions    None     PDMP not reviewed this encounter.   Mickie Bail, NP 01/27/20 [phone]

## 2020-01-27 NOTE — Discharge Instructions (Addendum)
Your urine pregnancy is negative.  A urine culture is pending.  We will call you if it indicates the need for treatment.    Your vaginal tests are pending.  If your test results are positive, we will call you.  You and your sexual partner(s) may require treatment at that time.  Do not have sexual activity until the test results are back.

## 2020-01-28 LAB — CERVICOVAGINAL ANCILLARY ONLY
Bacterial Vaginitis (gardnerella): POSITIVE — AB
Candida Glabrata: NEGATIVE
Candida Vaginitis: NEGATIVE
Chlamydia: NEGATIVE
Comment: NEGATIVE
Comment: NEGATIVE
Comment: NEGATIVE
Comment: NEGATIVE
Comment: NEGATIVE
Comment: NORMAL
Neisseria Gonorrhea: NEGATIVE
Trichomonas: NEGATIVE

## 2020-01-29 ENCOUNTER — Telehealth (HOSPITAL_COMMUNITY): Payer: Self-pay | Admitting: Emergency Medicine

## 2020-01-29 LAB — URINE CULTURE: Culture: 50000 — AB

## 2020-01-29 MED ORDER — NITROFURANTOIN MONOHYD MACRO 100 MG PO CAPS: 100.0000 mg | ORAL_CAPSULE | Freq: Two times a day (BID) | ORAL | 0 refills | Status: DC

## 2020-01-29 MED ORDER — METRONIDAZOLE 500 MG PO TABS: 500.0000 mg | ORAL_TABLET | Freq: Two times a day (BID) | ORAL | 0 refills | Status: DC

## 2020-03-05 ENCOUNTER — Ambulatory Visit (INDEPENDENT_AMBULATORY_CARE_PROVIDER_SITE_OTHER): Payer: Medicaid Other | Admitting: Obstetrics

## 2020-03-05 ENCOUNTER — Other Ambulatory Visit: Payer: Self-pay

## 2020-03-05 ENCOUNTER — Encounter: Payer: Self-pay | Admitting: Obstetrics

## 2020-03-05 ENCOUNTER — Other Ambulatory Visit (HOSPITAL_COMMUNITY)
Admission: RE | Admit: 2020-03-05 | Discharge: 2020-03-05 | Disposition: A | Discharge status: Home / Self Care | Payer: Medicaid Other | Source: Ambulatory Visit | Attending: Obstetrics | Admitting: Obstetrics

## 2020-03-05 VITALS — BP 126/80 | HR 84 | Ht 59.5 in | Wt 111.0 lb

## 2020-03-05 DIAGNOSIS — M549 Dorsalgia, unspecified: Secondary | ICD-10-CM | POA: Diagnosis not present

## 2020-03-05 DIAGNOSIS — R300 Dysuria: Secondary | ICD-10-CM | POA: Diagnosis not present

## 2020-03-05 DIAGNOSIS — Z113 Encounter for screening for infections with a predominantly sexual mode of transmission: Secondary | ICD-10-CM

## 2020-03-05 DIAGNOSIS — N898 Other specified noninflammatory disorders of vagina: Secondary | ICD-10-CM | POA: Insufficient documentation

## 2020-03-05 LAB — POCT URINALYSIS DIPSTICK
Bilirubin, UA: NEGATIVE
Blood, UA: NEGATIVE
Glucose, UA: NEGATIVE
Ketones, UA: POSITIVE
Nitrite, UA: NEGATIVE
Protein, UA: NEGATIVE
Spec Grav, UA: >=1.03 — AB (ref 1.010–1.025)
Urobilinogen, UA: 0.2 E.U./dL
pH, UA: 5 (ref 5.0–8.0)

## 2020-03-05 MED ORDER — CEFUROXIME AXETIL 500 MG PO TABS: 500.0000 mg | ORAL_TABLET | Freq: Two times a day (BID) | ORAL | 0 refills | Status: DC

## 2020-03-05 NOTE — Progress Notes (Signed)
Patient ID: Tammy Gardner, female   DOB: May 23, 1990, 29 y.o.   MRN: [id number]  Chief Complaint  Patient presents with  . Vaginal Discharge    HPI Tammy Gardner is a 29 y.o. female.  Treated a month ago for UTI and vaginal discharge.  She continues to have frequency and difficulty holding urine in bladder.  Also has an increase in vaginal discharge with irritation. HPI  Past Medical History:  Diagnosis Date  . Bacterial vaginosis   . Chlamydia   . Trichomonas     Past Surgical History:  Procedure Laterality Date  . INDUCED ABORTION      Family History  Problem Relation Age of Onset  . Hypertension Mother     Social History Social History   Tobacco Use  . Smoking status: Current Every Day Smoker    Packs/day: 0.25    Years: 1.00    Pack years: 0.25  . Smokeless tobacco: Never Used  . Tobacco comment: Black & Milds  Vaping Use  . Vaping Use: Never used  Substance Use Topics  . Alcohol use: Yes    Comment: occasionally  . Drug use: Yes    Types: Marijuana    No Known Allergies  Current Outpatient Medications  Medication Sig Dispense Refill  . cefUROXime (CEFTIN) 500 MG tablet Take 1 tablet (500 mg total) by mouth 2 (two) times daily with a meal. 14 tablet 0  . metroNIDAZOLE (FLAGYL) 500 MG tablet Take 1 tablet (500 mg total) by mouth 2 (two) times daily. (Patient not taking: Reported on 03/05/2020) 14 tablet 0   No current facility-administered medications for this visit.    Review of Systems Review of Systems Constitutional: negative for fatigue and weight loss Respiratory: negative for cough and wheezing Cardiovascular: negative for chest pain, fatigue and palpitations Gastrointestinal: negative for abdominal pain and change in bowel habits Genitourinary: positive for vaginal discharge with itching Integument/breast: negative for nipple discharge Musculoskeletal:negative for myalgias Neurological: negative for gait problems and  tremors Behavioral/Psych: negative for abusive relationship, depression Endocrine: negative for temperature intolerance      Blood pressure 126/80, pulse 84, height 4' 11.5" (1.511 m), weight 111 lb (50.3 kg), last menstrual period 02/04/2020.  Physical Exam Physical Exam           General: Alert and no distress Abdomen:  normal findings: no organomegaly, soft, non-tender and no hernia  Pelvis:  External genitalia: normal general appearance Urinary system: urethral meatus normal and bladder without fullness, nontender Vaginal: normal without tenderness, induration or masses Cervix: normal appearance Adnexa: normal bimanual exam Uterus: anteverted and non-tender, normal size    50% of 15 min visit spent on counseling and coordination of care.   Data Reviewed Labs Wet Prep and Cultures  Assessment     1. Dysuria Rx: - POCT Urinalysis Dipstick - Urine Culture - cefUROXime (CEFTIN) 500 MG tablet; Take 1 tablet (500 mg total) by mouth 2 (two) times daily with a meal.  Dispense: 14 tablet; Refill: 0  2. Backache without radiation - Ibuprofen prn  3. Vaginal discharge Rx: - Cervicovaginal ancillary only  4. Screening for STD (sexually transmitted disease) Rx: - HIV antibody (with reflex) - Hepatitis C Antibody - Hepatitis B surface antigen - RPR    Plan   Follow up prn  Orders Placed This Encounter  Procedures  . Urine Culture  . HIV antibody (with reflex)  . Hepatitis C Antibody  . Hepatitis B surface antigen  . RPR  .  POCT Urinalysis Dipstick   Meds ordered this encounter  Medications  . cefUROXime (CEFTIN) 500 MG tablet    Sig: Take 1 tablet (500 mg total) by mouth 2 (two) times daily with a meal.    Dispense:  14 tablet    Refill:  0     Brock Bad, MD 03/05/2020 1:58 PM

## 2020-03-05 NOTE — Progress Notes (Signed)
GYN presents for thick white vaginal discharge, itching, urinary frequency, pelvic/abdominal pain 3/10 x 1 week. She requested complete STD screening.  Last PAP 06/12/2019

## 2020-03-06 LAB — CERVICOVAGINAL ANCILLARY ONLY
Bacterial Vaginitis (gardnerella): POSITIVE — AB
Candida Glabrata: NEGATIVE
Candida Vaginitis: NEGATIVE
Chlamydia: NEGATIVE
Comment: NEGATIVE
Comment: NEGATIVE
Comment: NEGATIVE
Comment: NEGATIVE
Comment: NEGATIVE
Comment: NORMAL
Neisseria Gonorrhea: NEGATIVE
Trichomonas: POSITIVE — AB

## 2020-03-06 LAB — HIV ANTIBODY (ROUTINE TESTING W REFLEX): HIV Screen 4th Generation wRfx: NONREACTIVE

## 2020-03-06 LAB — HEPATITIS B SURFACE ANTIGEN: Hepatitis B Surface Ag: NEGATIVE

## 2020-03-06 LAB — RPR: RPR Ser Ql: NONREACTIVE

## 2020-03-06 LAB — HEPATITIS C ANTIBODY: Hep C Virus Ab: 0.2 s/co ratio (ref 0.0–0.9)

## 2020-03-07 ENCOUNTER — Other Ambulatory Visit: Payer: Self-pay | Admitting: Obstetrics

## 2020-03-07 DIAGNOSIS — B9689 Other specified bacterial agents as the cause of diseases classified elsewhere: Secondary | ICD-10-CM

## 2020-03-07 DIAGNOSIS — A5901 Trichomonal vulvovaginitis: Secondary | ICD-10-CM

## 2020-03-07 MED ORDER — METRONIDAZOLE 500 MG PO TABS: 500.0000 mg | ORAL_TABLET | Freq: Two times a day (BID) | ORAL | 0 refills | Status: DC

## 2020-03-09 LAB — URINE CULTURE

## 2020-03-25 ENCOUNTER — Other Ambulatory Visit: Payer: Self-pay

## 2020-03-25 ENCOUNTER — Other Ambulatory Visit (HOSPITAL_COMMUNITY)
Admission: RE | Admit: 2020-03-25 | Discharge: 2020-03-25 | Disposition: A | Discharge status: Home / Self Care | Payer: Medicaid Other | Source: Ambulatory Visit | Attending: Obstetrics and Gynecology | Admitting: Obstetrics and Gynecology

## 2020-03-25 ENCOUNTER — Ambulatory Visit (INDEPENDENT_AMBULATORY_CARE_PROVIDER_SITE_OTHER): Payer: Medicaid Other

## 2020-03-25 DIAGNOSIS — Z8619 Personal history of other infectious and parasitic diseases: Secondary | ICD-10-CM

## 2020-03-25 NOTE — Progress Notes (Signed)
Patient was assessed and managed by nursing staff during this encounter. I have reviewed the chart and agree with the documentation and plan. I have also made any necessary editorial changes.  Jaynie Collins, MD 03/25/2020 4:16 PM

## 2020-03-25 NOTE — Progress Notes (Signed)
Patient presents for [facility] for positive trich on 03/05/20. Patient denies having in vaginal discharge, odor or irritation. Self swab completed. Patient informed that results may take 24-48 hours to come back. Patient verbalized understanding.

## 2020-03-26 ENCOUNTER — Other Ambulatory Visit: Payer: Self-pay

## 2020-03-26 DIAGNOSIS — B9689 Other specified bacterial agents as the cause of diseases classified elsewhere: Secondary | ICD-10-CM

## 2020-03-26 DIAGNOSIS — A5901 Trichomonal vulvovaginitis: Secondary | ICD-10-CM

## 2020-03-26 DIAGNOSIS — N760 Acute vaginitis: Secondary | ICD-10-CM

## 2020-03-26 LAB — CERVICOVAGINAL ANCILLARY ONLY
Bacterial Vaginitis (gardnerella): POSITIVE — AB
Candida Glabrata: NEGATIVE
Candida Vaginitis: POSITIVE — AB
Chlamydia: NEGATIVE
Comment: NEGATIVE
Comment: NEGATIVE
Comment: NEGATIVE
Comment: NEGATIVE
Comment: NEGATIVE
Comment: NORMAL
Neisseria Gonorrhea: NEGATIVE
Trichomonas: NEGATIVE

## 2020-03-27 ENCOUNTER — Other Ambulatory Visit: Payer: Self-pay | Admitting: Obstetrics & Gynecology

## 2020-03-27 DIAGNOSIS — B3731 Acute candidiasis of vulva and vagina: Secondary | ICD-10-CM

## 2020-03-27 DIAGNOSIS — B373 Candidiasis of vulva and vagina: Secondary | ICD-10-CM

## 2020-03-27 MED ORDER — FLUCONAZOLE 150 MG PO TABS: 150.0000 mg | ORAL_TABLET | Freq: Once | ORAL | 3 refills | Status: AC

## 2020-03-27 MED ORDER — METRONIDAZOLE 500 MG PO TABS: 500.0000 mg | ORAL_TABLET | Freq: Two times a day (BID) | ORAL | 0 refills | Status: DC

## 2020-05-14 ENCOUNTER — Ambulatory Visit (INDEPENDENT_AMBULATORY_CARE_PROVIDER_SITE_OTHER): Payer: Medicaid Other | Admitting: *Deleted

## 2020-05-14 ENCOUNTER — Other Ambulatory Visit: Payer: Self-pay

## 2020-05-14 ENCOUNTER — Other Ambulatory Visit (HOSPITAL_COMMUNITY)
Admission: RE | Admit: 2020-05-14 | Discharge: 2020-05-14 | Disposition: A | Discharge status: Home / Self Care | Payer: Medicaid Other | Source: Ambulatory Visit | Attending: Obstetrics | Admitting: Obstetrics

## 2020-05-14 DIAGNOSIS — Z113 Encounter for screening for infections with a predominantly sexual mode of transmission: Secondary | ICD-10-CM | POA: Insufficient documentation

## 2020-05-14 DIAGNOSIS — R399 Unspecified symptoms and signs involving the genitourinary system: Secondary | ICD-10-CM

## 2020-05-14 DIAGNOSIS — N898 Other specified noninflammatory disorders of vagina: Secondary | ICD-10-CM

## 2020-05-14 LAB — POCT URINALYSIS DIPSTICK
Bilirubin, UA: NEGATIVE
Blood, UA: NEGATIVE
Glucose, UA: NEGATIVE
Ketones, UA: NEGATIVE
Leukocytes, UA: NEGATIVE
Nitrite, UA: POSITIVE
Protein, UA: NEGATIVE
Spec Grav, UA: 1.015 (ref 1.010–1.025)
Urobilinogen, UA: 0.2 E.U./dL
pH, UA: 7 (ref 5.0–8.0)

## 2020-05-14 MED ORDER — NITROFURANTOIN MONOHYD MACRO 100 MG PO CAPS: 100.0000 mg | ORAL_CAPSULE | Freq: Two times a day (BID) | ORAL | 0 refills | Status: DC

## 2020-05-14 NOTE — Progress Notes (Signed)
SUBJECTIVE: Tammy Gardner is a 30 y.o. female who complains of urinary frequency, urgency and vaginal discharge  x 2-3 days, without flank pain, fever, chills, or abnormal vaginal discharge or bleeding. Pt request full swab panel to be ordered today.     OBJECTIVE: Appears well, in no apparent distress. Urine dipstick shows positive for nitrates.    ASSESSMENT: Dysuria, Vaginal discharge   PLAN: Treatment per protocol. Macrobid sent today.  Self swab sent today, will await results for treatment.

## 2020-05-14 NOTE — Addendum Note (Signed)
Addended by: Marya Landry D on: 05/14/2020 02:46 PM   Modules accepted: Orders

## 2020-05-15 LAB — CERVICOVAGINAL ANCILLARY ONLY
Bacterial Vaginitis (gardnerella): POSITIVE — AB
Candida Glabrata: NEGATIVE
Candida Vaginitis: POSITIVE — AB
Chlamydia: NEGATIVE
Comment: NEGATIVE
Comment: NEGATIVE
Comment: NEGATIVE
Comment: NEGATIVE
Comment: NEGATIVE
Comment: NORMAL
Neisseria Gonorrhea: NEGATIVE
Trichomonas: NEGATIVE

## 2020-05-16 LAB — URINE CULTURE

## 2020-05-16 NOTE — Progress Notes (Signed)
I have reviewed this chart and agree with the RN/CMA assessment and management.    K. Meryl Davis, MD, FACOG [facility]ending [facility] (Faculty Practice)  

## 2020-05-17 ENCOUNTER — Other Ambulatory Visit: Payer: Self-pay | Admitting: Obstetrics and Gynecology

## 2020-05-17 MED ORDER — METRONIDAZOLE 500 MG PO TABS: 500.0000 mg | ORAL_TABLET | Freq: Two times a day (BID) | ORAL | 0 refills | Status: DC

## 2020-05-17 MED ORDER — FLUCONAZOLE 150 MG PO TABS: 150.0000 mg | ORAL_TABLET | Freq: Once | ORAL | 1 refills | Status: AC

## 2020-06-16 ENCOUNTER — Ambulatory Visit (HOSPITAL_COMMUNITY)
Admission: EM | Admit: 2020-06-16 | Discharge: 2020-06-16 | Disposition: A | Discharge status: Home / Self Care | Payer: Medicaid Other | Attending: Emergency Medicine | Admitting: Emergency Medicine

## 2020-06-16 ENCOUNTER — Other Ambulatory Visit: Payer: Self-pay

## 2020-06-16 ENCOUNTER — Encounter (HOSPITAL_COMMUNITY): Payer: Self-pay

## 2020-06-16 DIAGNOSIS — N390 Urinary tract infection, site not specified: Secondary | ICD-10-CM | POA: Diagnosis not present

## 2020-06-16 DIAGNOSIS — Z113 Encounter for screening for infections with a predominantly sexual mode of transmission: Secondary | ICD-10-CM | POA: Diagnosis present

## 2020-06-16 DIAGNOSIS — N898 Other specified noninflammatory disorders of vagina: Secondary | ICD-10-CM

## 2020-06-16 DIAGNOSIS — R350 Frequency of micturition: Secondary | ICD-10-CM | POA: Diagnosis not present

## 2020-06-16 LAB — POCT URINALYSIS DIPSTICK, ED / UC
Bilirubin Urine: NEGATIVE
Glucose, UA: NEGATIVE mg/dL
Hgb urine dipstick: NEGATIVE
Ketones, ur: NEGATIVE mg/dL
Leukocytes,Ua: NEGATIVE
Nitrite: POSITIVE — AB
Protein, ur: NEGATIVE mg/dL
Specific Gravity, Urine: 1.025 (ref 1.005–1.030)
Urobilinogen, UA: 0.2 mg/dL (ref 0.0–1.0)
pH: 6.5 (ref 5.0–8.0)

## 2020-06-16 MED ORDER — CEPHALEXIN 500 MG PO CAPS: 500.0000 mg | ORAL_CAPSULE | Freq: Two times a day (BID) | ORAL | 0 refills | Status: AC

## 2020-06-16 NOTE — ED Triage Notes (Signed)
Pt in with c/o urinary frequency and vaginal discharge that has been going on for about 5 days now  States she was seen approx 1 month ago for yeast infection but she is still having discharge

## 2020-06-16 NOTE — Discharge Instructions (Addendum)
You have a UTI.  Take the Keflex twice a day for 7 days.   Drink lots of fluids (especially water).   We will contact you with the results of the swab and if any additional treatment is needed.   Use condoms or other barrier method for all sexual encounters.    Return or go to the Emergency Department if symptoms worsen or do not improve in the next few days.

## 2020-06-16 NOTE — ED Provider Notes (Signed)
[facility]   [id number] 06/16/20 Arrival Time: 1035   CC: VAGINAL DISCHARGE  SUBJECTIVE:  Tammy Gardner is a 30 y.o. female who presents with complaints of abrupt vaginal discharge that began 1 month ago ago.  Also reports urinary urgency and dysuria.  Denies a precipitating event, recent sexual encounter.  Treated for BV and yeast about 1 month ago, but discharge never resolved completely.  Patient is sexually active.  Describes discharge as white and then.  Has tried OTC medications without relief.  Reports similar symptoms in the past and was diagnosed STI.  Denies fever, chills, nausea, vomiting, abdominal or pelvic pain, vaginal itching, vaginal odor, vaginal bleeding, dyspareunia, vaginal rashes or lesions.   Patient's last menstrual period was 05/27/2020 (approximate).  ROS: As per HPI.  All other pertinent ROS negative.     Past Medical History:  Diagnosis Date  . Bacterial vaginosis   . Chlamydia   . Trichomonas    Past Surgical History:  Procedure Laterality Date  . INDUCED ABORTION     No Known Allergies No current facility-administered medications on file prior to encounter.   Current Outpatient Medications on File Prior to Encounter  Medication Sig Dispense Refill  . metroNIDAZOLE (FLAGYL) 500 MG tablet Take 1 tablet (500 mg total) by mouth 2 (two) times daily. 14 tablet 0  . nitrofurantoin, macrocrystal-monohydrate, (MACROBID) 100 MG capsule Take 1 capsule (100 mg total) by mouth 2 (two) times daily. 1 po BID x 7days 14 capsule 0    Social History   Socioeconomic History  . Marital status: Single    Spouse name: Not on file  . Number of children: Not on file  . Years of education: Not on file  . Highest education level: Not on file  Occupational History  . Not on file  Tobacco Use  . Smoking status: Current Every Day Smoker    Packs/day: 0.25    Years: 1.00    Pack years: 0.25  . Smokeless tobacco: Never Used  . Tobacco comment: Black &  Milds  Vaping Use  . Vaping Use: Never used  Substance and Sexual Activity  . Alcohol use: Yes    Comment: occasionally  . Drug use: Yes    Types: Marijuana  . Sexual activity: Yes    Partners: Male  Other Topics Concern  . Not on file  Social History Narrative  . Not on file   Social Determinants of Health   Financial Resource Strain: Not on file  Food Insecurity: Not on file  Transportation Needs: Not on file  Physical Activity: Not on file  Stress: Not on file  Social Connections: Not on file  Intimate Partner Violence: Not on file   Family History  Problem Relation Age of Onset  . Hypertension Mother     OBJECTIVE:  Vitals:   06/16/20 1133  BP: 121/69  Pulse: 60  Resp: 18  Temp: 98.1 F (36.7 C)  SpO2: 99%     General appearance: Alert, NAD, appears stated age Head: NCAT Throat: lips, mucosa, and tongue normal; teeth and gums normal Lungs: CTA bilaterally without adventitious breath sounds Heart: regular rate and rhythm.  Radial pulses 2+ symmetrical bilaterally Back: no CVA tenderness Abdomen: soft, non-tender; bowel sounds normal; no masses or organomegaly; no guarding or rebound tenderness GU: declines  Skin: warm and dry Psychological:  Alert and cooperative. Normal mood and affect.  LABS:  Results for orders placed or performed during the hospital encounter of 06/16/20  POCT Urinalysis Dipstick (ED/UC)  Result Value Ref Range   Glucose, UA NEGATIVE NEGATIVE mg/dL   Bilirubin Urine NEGATIVE NEGATIVE   Ketones, ur NEGATIVE NEGATIVE mg/dL   Specific Gravity, Urine 1.025 1.005 - 1.030   Hgb urine dipstick NEGATIVE NEGATIVE   pH 6.5 5.0 - 8.0   Protein, ur NEGATIVE NEGATIVE mg/dL   Urobilinogen, UA 0.2 0.0 - 1.0 mg/dL   Nitrite POSITIVE (A) NEGATIVE   Leukocytes,Ua NEGATIVE NEGATIVE    Labs Reviewed  POCT URINALYSIS DIPSTICK, ED / UC - Abnormal; Notable for the following components:      Result Value   Nitrite POSITIVE (*)    All other  components within normal limits  URINE CULTURE  CERVICOVAGINAL ANCILLARY ONLY    ASSESSMENT & PLAN:  1. Urinary tract infection without hematuria, site unspecified   2. Vaginal discharge   3. Screen for STD (sexually transmitted disease)     Meds ordered this encounter  Medications  . cephALEXin (KEFLEX) 500 MG capsule    Sig: Take 1 capsule (500 mg total) by mouth 2 (two) times daily for 7 days.    Dispense:  14 capsule    Refill:  0    Order Specific Question:   Supervising Provider    Answer:   Merrilee Jansky [[id number]]    Pending: Labs Reviewed  POCT URINALYSIS DIPSTICK, ED / UC - Abnormal; Notable for the following components:      Result Value   Nitrite POSITIVE (*)    All other components within normal limits  URINE CULTURE  CERVICOVAGINAL ANCILLARY ONLY    Urine with signs of UTI.  Keflex 500mg  BID x7 days.  Encourage fluid intake  Cytology self-swab obtained.   We will follow up with you regarding abnormal results  Take medications as prescribed and to completion If tests results are positive, please abstain from sexual activity until you and your partner(s) have been treated Follow up with PCP or [facility] if symptoms persists Return here or go to ER if you have any new or worsening symptoms fever, chills, nausea, vomiting, abdominal or pelvic pain, painful intercourse, persistent symptoms despite treatment Reviewed expectations re: course of current medical issues. Questions answered. Outlined signs and symptoms indicating need for more acute intervention. Patient verbalized understanding. After Visit Summary given.       , NP 06/16/20 1204

## 2020-06-17 ENCOUNTER — Telehealth (HOSPITAL_COMMUNITY): Payer: Self-pay | Admitting: Emergency Medicine

## 2020-06-17 LAB — CERVICOVAGINAL ANCILLARY ONLY
Bacterial Vaginitis (gardnerella): NEGATIVE
Candida Glabrata: NEGATIVE
Candida Vaginitis: POSITIVE — AB
Chlamydia: NEGATIVE
Comment: NEGATIVE
Comment: NEGATIVE
Comment: NEGATIVE
Comment: NEGATIVE
Comment: NEGATIVE
Comment: NORMAL
Neisseria Gonorrhea: NEGATIVE
Trichomonas: NEGATIVE

## 2020-06-17 MED ORDER — FLUCONAZOLE 150 MG PO TABS: 150.0000 mg | ORAL_TABLET | Freq: Once | ORAL | 0 refills | Status: AC

## 2020-06-18 LAB — URINE CULTURE: Culture: >=100000 — AB

## 2020-09-04 ENCOUNTER — Other Ambulatory Visit: Payer: Self-pay

## 2020-09-04 ENCOUNTER — Encounter (HOSPITAL_COMMUNITY): Payer: Self-pay

## 2020-09-04 ENCOUNTER — Ambulatory Visit (HOSPITAL_COMMUNITY)
Admission: EM | Admit: 2020-09-04 | Discharge: 2020-09-04 | Disposition: A | Discharge status: Home / Self Care | Payer: Medicaid Other | Attending: Urgent Care | Admitting: Urgent Care

## 2020-09-04 DIAGNOSIS — N3000 Acute cystitis without hematuria: Secondary | ICD-10-CM

## 2020-09-04 DIAGNOSIS — R3915 Urgency of urination: Secondary | ICD-10-CM | POA: Diagnosis present

## 2020-09-04 DIAGNOSIS — B9689 Other specified bacterial agents as the cause of diseases classified elsewhere: Secondary | ICD-10-CM | POA: Diagnosis not present

## 2020-09-04 DIAGNOSIS — N949 Unspecified condition associated with female genital organs and menstrual cycle: Secondary | ICD-10-CM | POA: Diagnosis not present

## 2020-09-04 LAB — POCT URINALYSIS DIPSTICK, ED / UC
Bilirubin Urine: NEGATIVE
Glucose, UA: NEGATIVE mg/dL
Hgb urine dipstick: NEGATIVE
Leukocytes,Ua: NEGATIVE
Nitrite: POSITIVE — AB
Protein, ur: NEGATIVE mg/dL
Specific Gravity, Urine: 1.025 (ref 1.005–1.030)
Urobilinogen, UA: 0.2 mg/dL (ref 0.0–1.0)
pH: 5.5 (ref 5.0–8.0)

## 2020-09-04 LAB — POC URINE PREG, ED: Preg Test, Ur: NEGATIVE

## 2020-09-04 MED ORDER — CEPHALEXIN 500 MG PO CAPS: 500.0000 mg | ORAL_CAPSULE | Freq: Two times a day (BID) | ORAL | 0 refills | Status: DC

## 2020-09-04 MED ORDER — FLUCONAZOLE 150 MG PO TABS: 150.0000 mg | ORAL_TABLET | ORAL | 0 refills | Status: DC

## 2020-09-04 NOTE — ED Provider Notes (Signed)
[facility]   MRN: [id number] DOB: 1990-05-22  Subjective:   Tammy Gardner is a 30 y.o. female presenting for 2-day history of genital irritation, urinary urgency.  Denies fever, nausea, vomiting, abdominal, pelvic pain.  She has had some vaginal discharge but does not have a bad odor.  No genital rash.  She is sexually active, has 1 female partner, uses condoms for protection.  No birth control.  Patient does have a history of BV, chlamydia and trichomoniasis.  States that she is worried about having BV again.  Admits that she does not hydrate with water well at all.  Has been drinking a lot of alcohol and coffee lately.  Denies taking chronic medications.  No Known Allergies  Past Medical History:  Diagnosis Date  . Bacterial vaginosis   . Chlamydia   . Trichomonas      Past Surgical History:  Procedure Laterality Date  . INDUCED ABORTION      Family History  Problem Relation Age of Onset  . Hypertension Mother     Social History   Tobacco Use  . Smoking status: Current Every Day Smoker    Packs/day: 0.25    Years: 1.00    Pack years: 0.25  . Smokeless tobacco: Never Used  . Tobacco comment: Black & Milds  Vaping Use  . Vaping Use: Never used  Substance Use Topics  . Alcohol use: Yes    Comment: occasionally  . Drug use: Yes    Types: Marijuana    ROS   Objective:   Vitals: BP 135/82 (BP Location: Right Arm)   Pulse 61   Temp 98.4 F (36.9 C) (Oral)   Resp 17   LMP 08/21/2020 (Exact Date)   SpO2 98%   Physical Exam Constitutional:      General: She is not in acute distress.    Appearance: Normal appearance. She is well-developed. She is not ill-appearing, toxic-appearing or diaphoretic.  HENT:     Head: Normocephalic and atraumatic.     Nose: Nose normal.     Mouth/Throat:     Mouth: Mucous membranes are moist.     Pharynx: Oropharynx is clear.  Eyes:     General: No scleral icterus.    Extraocular Movements: Extraocular  movements intact.     Pupils: Pupils are equal, round, and reactive to light.  Cardiovascular:     Rate and Rhythm: Normal rate.  Pulmonary:     Effort: Pulmonary effort is normal.  Abdominal:     General: Bowel sounds are normal. There is no distension.     Palpations: Abdomen is soft. There is no mass.     Tenderness: There is no abdominal tenderness. There is no right CVA tenderness, left CVA tenderness, guarding or rebound.  Skin:    General: Skin is warm and dry.  Neurological:     General: No focal deficit present.     Mental Status: She is alert and oriented to person, place, and time.  Psychiatric:        Mood and Affect: Mood normal.        Behavior: Behavior normal.        Thought Content: Thought content normal.        Judgment: Judgment normal.     Results for orders placed or performed during the hospital encounter of 09/04/20 (from the past 24 hour(s))  POC Urinalysis dipstick     Status: Abnormal   Collection Time: 09/04/20 11:18 AM  Result Value Ref Range   Glucose, UA NEGATIVE NEGATIVE mg/dL   Bilirubin Urine NEGATIVE NEGATIVE   Ketones, ur TRACE (A) NEGATIVE mg/dL   Specific Gravity, Urine 1.025 1.005 - 1.030   Hgb urine dipstick NEGATIVE NEGATIVE   pH 5.5 5.0 - 8.0   Protein, ur NEGATIVE NEGATIVE mg/dL   Urobilinogen, UA 0.2 0.0 - 1.0 mg/dL   Nitrite POSITIVE (A) NEGATIVE   Leukocytes,Ua NEGATIVE NEGATIVE  POC urine pregnancy     Status: None   Collection Time: 09/04/20 11:22 AM  Result Value Ref Range   Preg Test, Ur NEGATIVE NEGATIVE    Assessment and Plan :   PDMP not reviewed this encounter.  1. Acute cystitis without hematuria   2. Pain of female genitalia   3. Urinary urgency     Patient has reassuring physical exam findings and vital signs.  Low suspicion for pyelonephritis, PID. Start Keflex to cover for acute cystitis, urine culture pending.  Recommended aggressive hydration, limiting urinary irritants.  Patient also has yeast infections  associated with antibiotic use and therefore will cover for this with Diflucan.  Otherwise, will treat based off of lab results.  Counseled patient on potential for adverse effects with medications prescribed/recommended today, ER and return-to-clinic precautions discussed, patient verbalized understanding.    Wallis Bamberg, PA-C 09/04/20 1133

## 2020-09-04 NOTE — Discharge Instructions (Signed)

## 2020-09-04 NOTE — ED Triage Notes (Signed)
Pt in with c/o vaginal irritation  X 2 days   Denies any abdominal or back pain  Pt states she has had BV before and this feels similar  Pt state she cannot take Flagyl because it makes her feel sick

## 2020-09-05 ENCOUNTER — Telehealth (HOSPITAL_COMMUNITY): Payer: Self-pay | Admitting: Emergency Medicine

## 2020-09-05 LAB — CERVICOVAGINAL ANCILLARY ONLY
Bacterial Vaginitis (gardnerella): POSITIVE — AB
Candida Glabrata: NEGATIVE
Candida Vaginitis: NEGATIVE
Chlamydia: NEGATIVE
Comment: NEGATIVE
Comment: NEGATIVE
Comment: NEGATIVE
Comment: NEGATIVE
Comment: NEGATIVE
Comment: NORMAL
Neisseria Gonorrhea: NEGATIVE
Trichomonas: NEGATIVE

## 2020-09-05 MED ORDER — CLINDAMYCIN HCL 150 MG PO CAPS: 300.0000 mg | ORAL_CAPSULE | Freq: Two times a day (BID) | ORAL | 0 refills | Status: AC

## 2020-10-31 ENCOUNTER — Other Ambulatory Visit: Payer: Self-pay

## 2020-10-31 ENCOUNTER — Ambulatory Visit (HOSPITAL_COMMUNITY)
Admission: EM | Admit: 2020-10-31 | Discharge: 2020-10-31 | Disposition: A | Discharge status: Home / Self Care | Payer: Medicaid Other | Attending: Internal Medicine | Admitting: Internal Medicine

## 2020-10-31 ENCOUNTER — Encounter (HOSPITAL_COMMUNITY): Payer: Self-pay

## 2020-10-31 DIAGNOSIS — N898 Other specified noninflammatory disorders of vagina: Secondary | ICD-10-CM

## 2020-10-31 DIAGNOSIS — R350 Frequency of micturition: Secondary | ICD-10-CM | POA: Insufficient documentation

## 2020-10-31 DIAGNOSIS — N390 Urinary tract infection, site not specified: Secondary | ICD-10-CM

## 2020-10-31 LAB — POCT URINALYSIS DIPSTICK, ED / UC
Bilirubin Urine: NEGATIVE
Glucose, UA: NEGATIVE mg/dL
Hgb urine dipstick: NEGATIVE
Ketones, ur: NEGATIVE mg/dL
Nitrite: POSITIVE — AB
Protein, ur: NEGATIVE mg/dL
Specific Gravity, Urine: 1.02 (ref 1.005–1.030)
Urobilinogen, UA: 0.2 mg/dL (ref 0.0–1.0)
pH: 7 (ref 5.0–8.0)

## 2020-10-31 MED ORDER — CEPHALEXIN 500 MG PO CAPS: 500.0000 mg | ORAL_CAPSULE | Freq: Two times a day (BID) | ORAL | 0 refills | Status: DC

## 2020-10-31 NOTE — ED Triage Notes (Signed)
Pt states she might have BV, increased urinary frequency, discharge, started two days ago.

## 2020-11-03 ENCOUNTER — Telehealth (HOSPITAL_COMMUNITY): Payer: Self-pay | Admitting: Emergency Medicine

## 2020-11-03 LAB — CERVICOVAGINAL ANCILLARY ONLY
Bacterial Vaginitis (gardnerella): POSITIVE — AB
Candida Glabrata: NEGATIVE
Candida Vaginitis: NEGATIVE
Chlamydia: NEGATIVE
Comment: NEGATIVE
Comment: NEGATIVE
Comment: NEGATIVE
Comment: NEGATIVE
Comment: NEGATIVE
Comment: NORMAL
Neisseria Gonorrhea: NEGATIVE
Trichomonas: NEGATIVE

## 2020-11-03 LAB — URINE CULTURE: Culture: >=100000 — AB

## 2020-11-03 MED ORDER — CLINDAMYCIN HCL 150 MG PO CAPS: 300.0000 mg | ORAL_CAPSULE | Freq: Two times a day (BID) | ORAL | 0 refills | Status: AC

## 2020-11-04 NOTE — ED Provider Notes (Signed)
[facility]    CSN: [id number] Arrival date & time: 10/31/20  1849      History   Chief Complaint Chief Complaint  Patient presents with   Urinary Frequency    HPI Tammy Gardner is a 30 y.o. female.   tPatient presenting today with urinary frequency, vaginal discharge x 2 days. Denies fever, chills, dysuria, hematuria, flank pain, abdominal or pelvic pain. Hx of BV that has felt similar. Not trying anything OTC.    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 Authorizing Provider  cephALEXin (KEFLEX) 500 MG capsule Take 1 capsule (500 mg total) by mouth 2 (two) times daily. 10/31/20   Particia Nearing, PA-C  clindamycin (CLEOCIN) 150 MG capsule Take 2 capsules (300 mg total) by mouth 2 (two) times daily for 7 days. 11/03/20 11/10/20  Merrilee Jansky, MD  fluconazole (DIFLUCAN) 150 MG tablet Take 1 tablet (150 mg total) by mouth once a week. 09/04/20   Wallis Bamberg, PA-C    Family History Family History  Problem Relation Age of Onset   Hypertension Mother     Social History Social History   Tobacco Use   Smoking status: Every Day    Packs/day: 0.25    Years: 1.00    Pack years: 0.25    Types: Cigarettes   Smokeless tobacco: Never   Tobacco comments:    Black & Milds  Vaping Use   Vaping Use: Never used   Substance Use Topics   Alcohol use: Yes    Comment: occasionally   Drug use: Yes    Types: Marijuana     Allergies   Patient has no known allergies.   Review of Systems Review of Systems PER HPI   Physical Exam Triage Vital Signs ED Triage Vitals  Enc Vitals Group     BP 10/31/20 1924 133/74     Pulse Rate 10/31/20 1924 78     Resp 10/31/20 1924 18     Temp 10/31/20 1924 98.7 F (37.1 C)     Temp Source 10/31/20 1924 Oral     SpO2 10/31/20 1924 100 %     Weight --      Height --      Head Circumference --      Peak Flow --      Pain Score 10/31/20 1925 0     Pain Loc --      Pain Edu? --      Excl. in GC? --    No data found.  Updated Vital  Signs BP 133/74 (BP Location: Right Arm)   Pulse 78   Temp 98.7 F (37.1 C) (Oral)   Resp 18   LMP 10/15/2020   SpO2 100%   Visual Acuity Right Eye Distance:   Left Eye Distance:   Bilateral Distance:    Right Eye Near:   Left Eye Near:    Bilateral Near:     Physical Exam Vitals and nursing note reviewed.  Constitutional:      Appearance: Normal appearance. She is not ill-appearing.  HENT:     Head: Atraumatic.  Eyes:     Extraocular Movements: Extraocular movements intact.     Conjunctiva/sclera: Conjunctivae normal.  Cardiovascular:     Rate and Rhythm: Normal rate and regular rhythm.     Heart sounds: Normal heart sounds.  Pulmonary:     Effort: Pulmonary effort is normal.     Breath sounds: Normal breath sounds.  Abdominal:     General: Bowel sounds are normal. There is no distension.     Palpations: Abdomen is soft.     Tenderness: There is no abdominal tenderness. There is no right CVA tenderness, left CVA tenderness or guarding.  Genitourinary:    Comments: GU exam deferred, self swab performed Musculoskeletal:        General: Normal range of motion.     Cervical back: Normal range of motion and neck supple.  Skin:    General: Skin is warm and dry.  Neurological:     Mental Status: She  is alert and oriented to person, place, and time.  Psychiatric:        Mood and Affect: Mood normal.        Thought Content: Thought content normal.        Judgment: Judgment normal.     UC Treatments / Results  Labs (all labs ordered are listed, but only abnormal results are displayed) Labs Reviewed  URINE CULTURE - Abnormal; Notable for the following components:      Result Value   Culture   (*)    Value: >=100,000 COLONIES/mL ESCHERICHIA COLI >=100,000 COLONIES/mL STREPTOCOCCUS MITIS/ORALIS Standardized susceptibility testing for this organism is not available. Performed at [facility], [address], [place], [place] [address]    Organism ID, Bacteria ESCHERICHIA COLI (*)    All other components within normal limits  POCT URINALYSIS DIPSTICK, ED / UC - Abnormal; Notable for the following components:   Nitrite POSITIVE (*)    Leukocytes,Ua SMALL (*)    All other components within normal limits  CERVICOVAGINAL ANCILLARY ONLY - Abnormal; Notable for the following components:   Bacterial Vaginitis (gardnerella) Positive (*)    All other components within normal limits    EKG   Radiology No results found.  Procedures Procedures (including critical care time)  Medications Ordered in UC Medications - No data to display  Initial Impression / Assessment and Plan / UC Course  I have reviewed the triage vital signs and the nursing notes.  Pertinent labs & imaging results that were available during my care of the patient were reviewed by me and considered in my medical decision making (see chart for details).     U/A positive for UTI, will treat with keflex and await urine culture and vaginal swab. Adjust treatment based on results. Supportive home care and return precautions reviewed.   Final Clinical Impressions(s) / UC Diagnoses   Final diagnoses:  Urinary frequency  Vaginal discharge  Acute lower UTI   Discharge  Instructions   None    ED  Prescriptions     Medication Sig Dispense Auth. Provider   cephALEXin (KEFLEX) 500 MG capsule Take 1 capsule (500 mg total) by mouth 2 (two) times daily. 10 capsule Particia Nearing, [place]      PDMP not reviewed this encounter.   Particia Nearing, [place] 11/04/20 2214

## 2020-12-04 ENCOUNTER — Ambulatory Visit (INDEPENDENT_AMBULATORY_CARE_PROVIDER_SITE_OTHER): Payer: Medicaid Other | Admitting: Obstetrics

## 2020-12-04 ENCOUNTER — Other Ambulatory Visit: Payer: Self-pay

## 2020-12-04 ENCOUNTER — Other Ambulatory Visit (HOSPITAL_COMMUNITY)
Admission: RE | Admit: 2020-12-04 | Discharge: 2020-12-04 | Disposition: A | Discharge status: Home / Self Care | Payer: Medicaid Other | Source: Ambulatory Visit | Attending: Obstetrics | Admitting: Obstetrics

## 2020-12-04 ENCOUNTER — Encounter: Payer: Self-pay | Admitting: Obstetrics

## 2020-12-04 VITALS — BP 109/62 | HR 77 | Wt 106.0 lb

## 2020-12-04 DIAGNOSIS — F17200 Nicotine dependence, unspecified, uncomplicated: Secondary | ICD-10-CM | POA: Diagnosis not present

## 2020-12-04 DIAGNOSIS — Z01419 Encounter for gynecological examination (general) (routine) without abnormal findings: Secondary | ICD-10-CM | POA: Insufficient documentation

## 2020-12-04 DIAGNOSIS — Z0000 Encounter for general adult medical examination without abnormal findings: Secondary | ICD-10-CM

## 2020-12-04 DIAGNOSIS — Z113 Encounter for screening for infections with a predominantly sexual mode of transmission: Secondary | ICD-10-CM

## 2020-12-04 DIAGNOSIS — Z3009 Encounter for other general counseling and advice on contraception: Secondary | ICD-10-CM | POA: Diagnosis not present

## 2020-12-04 DIAGNOSIS — N898 Other specified noninflammatory disorders of vagina: Secondary | ICD-10-CM | POA: Diagnosis not present

## 2020-12-04 NOTE — Progress Notes (Signed)
Subjective:        Tammy Gardner is a 30 y.o. female here for a routine exam.  Current complaints: Vaginal discharge.    Personal health questionnaire:  Is patient [name], have a family history of breast and/or ovarian cancer: no Is there a family history of uterine cancer diagnosed at age < 10, gastrointestinal cancer, urinary tract cancer, family member who is a Personnel officer syndrome-associated carrier: no Is the patient overweight and hypertensive, family history of diabetes, personal history of gestational diabetes, preeclampsia or PCOS: no Is patient over 66, have PCOS,  family history of premature CHD under age 61, diabetes, smoke, have hypertension or peripheral artery disease:  no At any time, has a partner hit, kicked or otherwise hurt or frightened you?: no Over the past 2 weeks, have you felt down, depressed or hopeless?: no Over the past 2 weeks, have you felt little interest or pleasure in doing things?:no   Gynecologic History No LMP recorded. Contraception: none Last Pap: 06-12-2019. Results were: normal Last mammogram: n/a. Results were: n/a  Obstetric History OB History  Gravida Para Term Preterm AB Living  2 1 1  0 1 1  SAB IAB Ectopic Multiple Live Births  0 1 0 0 1    # Outcome Date GA Lbr Len/2nd Weight Sex Delivery Anes PTL Lv  2 Term 11/29/15 [redacted]w[redacted]d 10:36 / 00:26 5 lb 7.5 oz (2.48 kg) F Vag-Spont EPI  LIV     Birth Comments: none  1 IAB 2014            Past Medical History:  Diagnosis Date   Bacterial vaginosis    Chlamydia    Trichomonas     Past Surgical History:  Procedure Laterality Date   INDUCED ABORTION       Current Outpatient Medications:    cephALEXin (KEFLEX) 500 MG capsule, Take 1 capsule (500 mg total) by mouth 2 (two) times daily. (Patient not taking: Reported on 12/04/2020), Disp: 10 capsule, Rfl: 0 No Known Allergies  Social History   Tobacco Use   Smoking status: Every Day    Packs/day: 0.25    Years: 1.00    Pack  years: 0.25    Types: Cigarettes   Smokeless tobacco: Never   Tobacco comments:    Black & Milds  Substance Use Topics   Alcohol use: Yes    Comment: occasionally    Family History  Problem Relation Age of Onset   Hypertension Mother       Review of Systems  Constitutional: negative for fatigue and weight loss Respiratory: negative for cough and wheezing Cardiovascular: negative for chest pain, fatigue and palpitations Gastrointestinal: negative for abdominal pain and change in bowel habits Musculoskeletal:negative for myalgias Neurological: negative for gait problems and tremors Behavioral/Psych: negative for abusive relationship, depression Endocrine: negative for temperature intolerance    Genitourinary: positive for vaginal discharge.  negative for abnormal menstrual periods, genital lesions, hot flashes, sexual problems  Integument/breast: negative for breast lump, breast tenderness, nipple discharge and skin lesion(s)    Objective:       BP 109/62   Pulse 77   Wt 106 lb (48.1 kg)   BMI 21.05 kg/m  General:   Alert and no distress  Skin:   no rash or abnormalities  Lungs:   clear to auscultation bilaterally  Heart:   regular rate and rhythm, S1, S2 normal, no murmur, click, rub or gallop  Breasts:   normal without suspicious masses, skin or  nipple changes or axillary nodes  Abdomen:  normal findings: no organomegaly, soft, non-tender and no hernia  Pelvis:  External genitalia: normal general appearance Urinary system: urethral meatus normal and bladder without fullness, nontender Vaginal: normal without tenderness, induration or masses Cervix: normal appearance Adnexa: normal bimanual exam Uterus: anteverted and non-tender, normal size   Lab Review Urine pregnancy test Labs reviewed yes Radiologic studies reviewed no  I have spent a total of 20 minutes of face-to-face time, excluding clinical staff time, reviewing notes and preparing to see patient,  ordering tests and/or medications, and counseling the patient.   Assessment:    1. Encounter for routine gynecological examination with Papanicolaou smear of cervix Rx: - Cytology - PAP( [place])  2. Vaginal discharge Rx: - Cervicovaginal ancillary only  3. Screening for STD (sexually transmitted disease) Rx: - HIV Antibody (routine testing w rflx) - Hepatitis B surface antigen - RPR - Hepatitis C antibody  4. Encounter for other general counseling and advice on contraception - declines contraception - condoms recommended for STD prevention  5. Tobacco dependence - cessation recommended with the aid of medication and behavioral modification    Plan:    Education reviewed: calcium supplements, depression evaluation, low fat, low cholesterol diet, safe sex/STD prevention, self breast exams, smoking cessation, and weight bearing exercise. Contraception: condoms. Follow up in: 1 year.   No orders of the defined types were placed in this encounter.  Orders Placed This Encounter  Procedures   HIV Antibody (routine testing w rflx)   Hepatitis B surface antigen   RPR   Hepatitis C antibody     Brock Bad, MD 12/04/2020 9:48 AM

## 2020-12-05 LAB — RPR: RPR Ser Ql: NONREACTIVE

## 2020-12-05 LAB — HEPATITIS B SURFACE ANTIGEN: Hepatitis B Surface Ag: NEGATIVE

## 2020-12-05 LAB — HIV ANTIBODY (ROUTINE TESTING W REFLEX): HIV Screen 4th Generation wRfx: NONREACTIVE

## 2020-12-05 LAB — HEPATITIS C ANTIBODY: Hep C Virus Ab: <0.1 s/co ratio (ref 0.0–0.9)

## 2020-12-09 ENCOUNTER — Other Ambulatory Visit: Payer: Self-pay | Admitting: Obstetrics

## 2020-12-09 DIAGNOSIS — N760 Acute vaginitis: Secondary | ICD-10-CM

## 2020-12-09 DIAGNOSIS — B373 Candidiasis of vulva and vagina: Secondary | ICD-10-CM

## 2020-12-09 DIAGNOSIS — B3731 Acute candidiasis of vulva and vagina: Secondary | ICD-10-CM

## 2020-12-09 LAB — CERVICOVAGINAL ANCILLARY ONLY
Bacterial Vaginitis (gardnerella): POSITIVE — AB
Candida Glabrata: NEGATIVE
Candida Vaginitis: POSITIVE — AB
Chlamydia: NEGATIVE
Comment: NEGATIVE
Comment: NEGATIVE
Comment: NEGATIVE
Comment: NEGATIVE
Comment: NEGATIVE
Comment: NORMAL
Neisseria Gonorrhea: NEGATIVE
Trichomonas: NEGATIVE

## 2020-12-09 MED ORDER — METRONIDAZOLE 500 MG PO TABS
500.0000 mg | ORAL_TABLET | Freq: Two times a day (BID) | ORAL | 2 refills | Status: DC
Start: 2020-12-09 — End: 2021-05-07

## 2020-12-09 MED ORDER — FLUCONAZOLE 150 MG PO TABS: 150.0000 mg | ORAL_TABLET | Freq: Once | ORAL | 0 refills | Status: AC

## 2020-12-16 LAB — CYTOLOGY - PAP
Comment: NEGATIVE
Comment: NEGATIVE
Diagnosis: UNDETERMINED — AB
HPV 16: NEGATIVE
HPV 18 / 45: NEGATIVE
High risk HPV: POSITIVE — AB

## 2020-12-17 ENCOUNTER — Encounter: Payer: Self-pay | Admitting: *Deleted

## 2020-12-17 NOTE — Progress Notes (Signed)
Patient notified of abnormal PAP and need for colposcopy. Patient states she will call the office to schedule. Educational information sent via MyChart with patient's permission.

## 2020-12-21 ENCOUNTER — Emergency Department (HOSPITAL_COMMUNITY)
Admission: EM | Admit: 2020-12-21 | Discharge: 2020-12-21 | Discharge status: Against Medical Advice | Payer: Medicaid Other | Attending: Emergency Medicine | Admitting: Emergency Medicine

## 2020-12-21 NOTE — ED Triage Notes (Signed)
Pt came in for a colposcopy. This nurse notified her that we do not perform that here, but that we could test her for STDs. Pt elected to leave

## 2021-01-21 ENCOUNTER — Other Ambulatory Visit (HOSPITAL_COMMUNITY)
Admission: RE | Admit: 2021-01-21 | Discharge: 2021-01-21 | Disposition: A | Discharge status: Home / Self Care | Payer: Medicaid Other | Source: Ambulatory Visit | Attending: Obstetrics | Admitting: Obstetrics

## 2021-01-21 ENCOUNTER — Ambulatory Visit (INDEPENDENT_AMBULATORY_CARE_PROVIDER_SITE_OTHER): Payer: Medicaid Other | Admitting: Obstetrics

## 2021-01-21 ENCOUNTER — Other Ambulatory Visit: Payer: Self-pay

## 2021-01-21 ENCOUNTER — Encounter: Payer: Self-pay | Admitting: Obstetrics

## 2021-01-21 VITALS — BP 110/74 | HR 69 | Ht 60.0 in | Wt 108.3 lb

## 2021-01-21 DIAGNOSIS — N870 Mild cervical dysplasia: Secondary | ICD-10-CM

## 2021-01-21 DIAGNOSIS — R87610 Atypical squamous cells of undetermined significance on cytologic smear of cervix (ASC-US): Secondary | ICD-10-CM | POA: Insufficient documentation

## 2021-01-21 DIAGNOSIS — R87810 Cervical high risk human papillomavirus (HPV) DNA test positive: Secondary | ICD-10-CM

## 2021-01-21 DIAGNOSIS — Z3202 Encounter for pregnancy test, result negative: Secondary | ICD-10-CM | POA: Diagnosis not present

## 2021-01-21 DIAGNOSIS — B369 Superficial mycosis, unspecified: Secondary | ICD-10-CM

## 2021-01-21 LAB — POCT URINE PREGNANCY: Preg Test, Ur: NEGATIVE

## 2021-01-21 MED ORDER — CLOTRIMAZOLE-BETAMETHASONE 1-0.05 % EX CREA: 1.0000 "application " | TOPICAL_CREAM | Freq: Two times a day (BID) | CUTANEOUS | 0 refills | Status: DC

## 2021-01-21 NOTE — Progress Notes (Signed)
UPT:negative

## 2021-01-21 NOTE — Progress Notes (Signed)
Colposcopy Procedure Note  Indications: Pap smear 1 months ago showed: ASCUS with POSITIVE high risk HPV. The prior pap showed no abnormalities.  Prior cervical/vaginal disease: normal exam without visible pathology. Prior cervical treatment: no treatment.  Procedure Details  The risks and benefits of the procedure and Written informed consent obtained.  A time-out was performed confirming the patient, procedure and allergy status  Speculum placed in vagina and excellent visualization of cervix achieved, cervix swabbed x 3 with acetic acid solution.  Findings: Cervix: no visible lesions, no mosaicism, no punctation, and no abnormal vasculature; SCJ visualized 360 degrees without lesions, endocervical curettage performed, cervical biopsies taken at 6 and 12 o'clock, specimen labelled and sent to pathology, and hemostasis achieved with Monsel's solution.   Vaginal inspection: normal without visible lesions. Vulvar colposcopy: vulvar colposcopy not performed.   Physical Exam   Specimens: ECC and Cervical Biopsies  Complications: none.  Plan: Specimens labelled and sent to Pathology. Will base further treatment on Pathology findings. Treatment options discussed with patient. Post biopsy instructions given to patient. Return to discuss Pathology results in 2 weeks  Brock Bad, MD 01/21/2021 11:11 AM .

## 2021-01-22 LAB — SURGICAL PATHOLOGY

## 2021-01-23 ENCOUNTER — Telehealth: Payer: Self-pay

## 2021-01-23 NOTE — Telephone Encounter (Signed)
TC to patient to discuss test results. No answer or voice mail to leave a message.

## 2021-01-23 NOTE — Telephone Encounter (Signed)
-----   Message from Brock Bad, MD sent at 01/22/2021  1:12 PM EDT ----- [facility] colposcopic directed biopsies.  Repeat pap in 1 year.

## 2021-02-04 ENCOUNTER — Telehealth: Payer: Medicaid Other | Admitting: Obstetrics

## 2021-02-10 ENCOUNTER — Telehealth (INDEPENDENT_AMBULATORY_CARE_PROVIDER_SITE_OTHER): Payer: Medicaid Other | Admitting: Obstetrics

## 2021-02-10 ENCOUNTER — Encounter: Payer: Self-pay | Admitting: Obstetrics

## 2021-02-10 DIAGNOSIS — B3731 Acute candidiasis of vulva and vagina: Secondary | ICD-10-CM

## 2021-02-10 DIAGNOSIS — N870 Mild cervical dysplasia: Secondary | ICD-10-CM

## 2021-02-10 MED ORDER — FLUCONAZOLE 200 MG PO TABS
200.0000 mg | ORAL_TABLET | ORAL | 2 refills | Status: DC
Start: 2021-02-10 — End: 2023-05-21

## 2021-02-10 NOTE — Progress Notes (Signed)
    GYNECOLOGY VIRTUAL VISIT ENCOUNTER NOTE  Provider location: Center for [facility]   Patient location: Home  I connected with [name] on 02/10/21 at  8:55 AM EST by MyChart Video Encounter and verified that I am speaking with the correct person using two identifiers.   I discussed the limitations, risks, security and privacy concerns of performing an evaluation and management service virtually and the availability of in person appointments. I also discussed with the patient that there may be a patient responsible charge related to this service. The patient expressed understanding and agreed to proceed.   History:  Tammy Gardner is a 30 y.o. G17P1011 female being evaluated today for results of colposcopy for ASCUS with positive HRHPV. She denies any abnormal vaginal bleeding, pelvic pain or other concerns.  Recently treated for BV, and now has thick white curdy vaginal discharge with itching.     Past Medical History:  Diagnosis Date   Bacterial vaginosis    Chlamydia    Trichomonas    Past Surgical History:  Procedure Laterality Date   INDUCED ABORTION     The following portions of the patient's history were reviewed and updated as appropriate: allergies, current medications, past family history, past medical history, past social history, past surgical history and problem list.   Health Maintenance:  ASCUS pap and positive HRHPV on 01-21-2021.    Review of Systems:  Pertinent items noted in HPI and remainder of comprehensive ROS otherwise negative.  Physical Exam:   General:  Alert, oriented and cooperative. Patient appears to be in no acute distress.  Mental Status: Normal mood and affect. Normal behavior. Normal judgment and thought content.   Respiratory: Normal respiratory effort, no problems with respiration noted  Rest of physical exam deferred due to type of encounter  Labs and Imaging No results found for this or any previous visit (from  the past 336 hour(s)). No results found.     Assessment and Plan:     1. Dysplasia of cervix, low grade (CIN 1) - repeat pap in 1 year  2. Candida vaginitis Rx: - fluconazole (DIFLUCAN) 200 MG tablet; Take 1 tablet (200 mg total) by mouth every 3 (three) days.  Dispense: 3 tablet; Refill: 2       I discussed the assessment and treatment plan with the patient. The patient was provided an opportunity to ask questions and all were answered. The patient agreed with the plan and demonstrated an understanding of the instructions.   The patient was advised to call back or seek an in-person evaluation/go to the ED if the symptoms worsen or if the condition fails to improve as anticipated.  I have spent a total of 15 minutes of non-face-to-face time, excluding clinical staff time, reviewing notes and preparing to see patient, ordering tests and/or medications, and counseling the patient.    Coral Ceo, MD Center for [facility], [facility]  02/10/21

## 2021-04-14 ENCOUNTER — Ambulatory Visit: Payer: Medicaid Other

## 2021-04-15 ENCOUNTER — Ambulatory Visit: Payer: Medicaid Other

## 2021-04-29 ENCOUNTER — Ambulatory Visit: Payer: Medicaid Other

## 2021-05-05 ENCOUNTER — Ambulatory Visit
Admission: EM | Admit: 2021-05-05 | Discharge: 2021-05-05 | Disposition: A | Discharge status: Home / Self Care | Payer: Medicaid Other | Attending: Urgent Care | Admitting: Urgent Care

## 2021-05-05 ENCOUNTER — Encounter: Payer: Self-pay | Admitting: Emergency Medicine

## 2021-05-05 ENCOUNTER — Other Ambulatory Visit: Payer: Self-pay

## 2021-05-05 DIAGNOSIS — R350 Frequency of micturition: Secondary | ICD-10-CM | POA: Diagnosis not present

## 2021-05-05 DIAGNOSIS — N939 Abnormal uterine and vaginal bleeding, unspecified: Secondary | ICD-10-CM | POA: Insufficient documentation

## 2021-05-05 DIAGNOSIS — R3915 Urgency of urination: Secondary | ICD-10-CM | POA: Insufficient documentation

## 2021-05-05 DIAGNOSIS — Z7251 High risk heterosexual behavior: Secondary | ICD-10-CM | POA: Insufficient documentation

## 2021-05-05 LAB — POCT URINALYSIS DIP (MANUAL ENTRY)
Bilirubin, UA: NEGATIVE
Glucose, UA: NEGATIVE mg/dL
Ketones, POC UA: NEGATIVE mg/dL
Leukocytes, UA: NEGATIVE
Nitrite, UA: NEGATIVE
Protein Ur, POC: NEGATIVE mg/dL
Spec Grav, UA: 1.01 (ref 1.010–1.025)
Urobilinogen, UA: 0.2 E.U./dL
pH, UA: 7 (ref 5.0–8.0)

## 2021-05-05 LAB — POCT URINE PREGNANCY: Preg Test, Ur: NEGATIVE

## 2021-05-05 NOTE — ED Triage Notes (Signed)
Patient c/o urinary urgency, frequency and hematuria x 2 weeks.  Patient has some vaginal spotting as well, states she took a "Plan B" at the beginning of the month and feels like it has messed everything up.

## 2021-05-05 NOTE — ED Provider Notes (Signed)
Willacy   MRN: [id number] DOB: 19-Oct-1990  Subjective:   Tammy Gardner is a 31 y.o. female presenting for 2-week history of persistent urinary frequency, urinary urgency, intermittent hematuria, vaginal spotting.  She has had some pelvic cramping but has not been prominent.  Has history of bacterial vaginosis, chlamydia and trichomonas.  Of note, she did take Plan B and reports that her symptoms started thereafter.  She would like to make sure she does not have a UTI or STIs.  No fever, nausea, vomiting, active abdominal or pelvic pain, vaginal discharge, vaginal itching, genital rashes.  No current facility-administered medications for this encounter.  Current Outpatient Medications:    cephALEXin (KEFLEX) 500 MG capsule, Take 1 capsule (500 mg total) by mouth 2 (two) times daily. (Patient not taking: No sig reported), Disp: 10 capsule, Rfl: 0   clotrimazole-betamethasone (LOTRISONE) cream, Apply 1 application topically 2 (two) times daily., Disp: 30 g, Rfl: 0   fluconazole (DIFLUCAN) 200 MG tablet, Take 1 tablet (200 mg total) by mouth every 3 (three) days., Disp: 3 tablet, Rfl: 2   metroNIDAZOLE (FLAGYL) 500 MG tablet, Take 1 tablet (500 mg total) by mouth 2 (two) times daily. (Patient not taking: Reported on 01/21/2021), Disp: 14 tablet, Rfl: 2   No Known Allergies  Past Medical History:  Diagnosis Date   Bacterial vaginosis    Chlamydia    Trichomonas      Past Surgical History:  Procedure Laterality Date   INDUCED ABORTION      Family History  Problem Relation Age of Onset   Hypertension Mother     Social History   Tobacco Use   Smoking status: Every Day    Packs/day: 0.25    Years: 1.00    Pack years: 0.25    Types: Cigarettes   Smokeless tobacco: Never   Tobacco comments:    Black & Milds  Vaping Use   Vaping Use: Never used  Substance Use Topics   Alcohol use: Yes    Comment: occasionally   Drug use: Yes    Types: Marijuana     ROS   Objective:   Vitals: BP (!) 144/88 (BP Location: Left Arm)    Pulse 85    Temp 98.2 F (36.8 C) (Oral)    Ht 5' (1.524 m)    Wt 110 lb (49.9 kg)    LMP 04/05/2021    SpO2 98%    BMI 21.48 kg/m   Physical Exam Constitutional:      General: She is not in acute distress.    Appearance: Normal appearance. She is well-developed. She is not ill-appearing, toxic-appearing or diaphoretic.  HENT:     Head: Normocephalic and atraumatic.     Nose: Nose normal.     Mouth/Throat:     Mouth: Mucous membranes are moist.     Pharynx: Oropharynx is clear.  Eyes:     General: No scleral icterus.       Right eye: No discharge.        Left eye: No discharge.     Extraocular Movements: Extraocular movements intact.     Conjunctiva/sclera: Conjunctivae normal.  Cardiovascular:     Rate and Rhythm: Normal rate.  Pulmonary:     Effort: Pulmonary effort is normal.  Abdominal:     General: Bowel sounds are normal. There is no distension.     Palpations: Abdomen is soft. There is no mass.     Tenderness: There is no abdominal tenderness.  There is no right CVA tenderness, left CVA tenderness, guarding or rebound.  Skin:    General: Skin is warm and dry.  Neurological:     General: No focal deficit present.     Mental Status: She is alert and oriented to person, place, and time.  Psychiatric:        Mood and Affect: Mood normal.        Behavior: Behavior normal.        Thought Content: Thought content normal.        Judgment: Judgment normal.    Results for orders placed or performed during the hospital encounter of 05/05/21 (from the past 24 hour(s))  POCT urinalysis dipstick     Status: Abnormal   Collection Time: 05/05/21  9:58 AM  Result Value Ref Range   Color, UA yellow yellow   Clarity, UA clear clear   Glucose, UA negative negative mg/dL   Bilirubin, UA negative negative   Ketones, POC UA negative negative mg/dL   Spec Grav, UA 1.010 1.010 - 1.025   Blood, UA  trace-intact (A) negative   pH, UA 7.0 5.0 - 8.0   Protein Ur, POC negative negative mg/dL   Urobilinogen, UA 0.2 0.2 or 1.0 E.U./dL   Nitrite, UA Negative Negative   Leukocytes, UA Negative Negative  POCT urine pregnancy     Status: None   Collection Time: 05/05/21  9:58 AM  Result Value Ref Range   Preg Test, Ur Negative Negative    Assessment and Plan :   PDMP not reviewed this encounter.  1. Urinary frequency   2. Urinary urgency   3. Vaginal spotting   4. Unprotected sex    Testing pending, will treat as appropriate based off of the lab results.  For now, recommend supportive care, hydrating very well. Counseled patient on potential for adverse effects with medications prescribed/recommended today, ER and return-to-clinic precautions discussed, patient verbalized understanding.    Jaynee Eagles, PA-C 05/05/21 1007

## 2021-05-05 NOTE — Discharge Instructions (Signed)
Make sure you hydrate very well with plain water and a quantity of 64 ounces of water a day.  Please limit drinks that are considered urinary irritants such as soda, sweet tea, coffee, energy drinks, alcohol.  These can worsen your urinary and genital symptoms but also be the source of them.  I will let you know about your urine culture and vaginal swab results through MyChart to see if we need to prescribe or change your antibiotics based off of those results. ° °

## 2021-05-06 LAB — CERVICOVAGINAL ANCILLARY ONLY
Bacterial Vaginitis (gardnerella): POSITIVE — AB
Chlamydia: NEGATIVE
Comment: NEGATIVE
Comment: NEGATIVE
Comment: NEGATIVE
Comment: NORMAL
Neisseria Gonorrhea: NEGATIVE
Trichomonas: POSITIVE — AB

## 2021-05-06 LAB — URINE CULTURE: Culture: <10000 — AB

## 2021-05-07 ENCOUNTER — Telehealth (HOSPITAL_COMMUNITY): Payer: Self-pay | Admitting: Emergency Medicine

## 2021-05-07 MED ORDER — METRONIDAZOLE 500 MG PO TABS: 500.0000 mg | ORAL_TABLET | Freq: Two times a day (BID) | ORAL | 0 refills | Status: DC

## 2021-06-10 ENCOUNTER — Ambulatory Visit: Payer: Medicaid Other

## 2021-06-10 ENCOUNTER — Other Ambulatory Visit: Payer: Self-pay

## 2021-06-10 ENCOUNTER — Ambulatory Visit (INDEPENDENT_AMBULATORY_CARE_PROVIDER_SITE_OTHER): Payer: Medicaid Other | Admitting: *Deleted

## 2021-06-10 ENCOUNTER — Other Ambulatory Visit (HOSPITAL_COMMUNITY)
Admission: RE | Admit: 2021-06-10 | Discharge: 2021-06-10 | Disposition: A | Discharge status: Home / Self Care | Payer: Medicaid Other | Source: Ambulatory Visit | Attending: Obstetrics | Admitting: Obstetrics

## 2021-06-10 VITALS — BP 124/89 | HR 101

## 2021-06-10 DIAGNOSIS — Z113 Encounter for screening for infections with a predominantly sexual mode of transmission: Secondary | ICD-10-CM | POA: Diagnosis not present

## 2021-06-10 NOTE — Progress Notes (Signed)
SUBJECTIVE:  31 y.o. female who desires a STI screen. Denies abnormal vaginal discharge, bleeding or significant pelvic pain. No UTI symptoms. Denies history of known exposure to STD.  No LMP recorded.  OBJECTIVE:  She appears well.   ASSESSMENT:  STI Screen   PLAN:  Pt offered STI blood screening-requested GC, chlamydia, and trichomonas probe sent to lab.  Treatment: To be determined once lab results are received.  Pt follow up as needed.  

## 2021-06-11 LAB — HEPATITIS C ANTIBODY: Hep C Virus Ab: NONREACTIVE

## 2021-06-11 LAB — CERVICOVAGINAL ANCILLARY ONLY
Chlamydia: NEGATIVE
Comment: NEGATIVE
Comment: NEGATIVE
Comment: NORMAL
Neisseria Gonorrhea: NEGATIVE
Trichomonas: NEGATIVE

## 2021-06-11 LAB — HEPATITIS B SURFACE ANTIGEN: Hepatitis B Surface Ag: NEGATIVE

## 2021-06-11 LAB — HIV ANTIBODY (ROUTINE TESTING W REFLEX): HIV Screen 4th Generation wRfx: NONREACTIVE

## 2021-06-11 LAB — RPR: RPR Ser Ql: NONREACTIVE

## 2021-12-18 ENCOUNTER — Ambulatory Visit (INDEPENDENT_AMBULATORY_CARE_PROVIDER_SITE_OTHER): Payer: Medicaid Other

## 2021-12-18 ENCOUNTER — Other Ambulatory Visit (HOSPITAL_COMMUNITY)
Admission: RE | Admit: 2021-12-18 | Discharge: 2021-12-18 | Disposition: A | Discharge status: Home / Self Care | Payer: Medicaid Other | Source: Ambulatory Visit | Attending: Obstetrics and Gynecology | Admitting: Obstetrics and Gynecology

## 2021-12-18 VITALS — BP 136/89 | HR 90 | Wt 113.0 lb

## 2021-12-18 DIAGNOSIS — N898 Other specified noninflammatory disorders of vagina: Secondary | ICD-10-CM | POA: Insufficient documentation

## 2021-12-18 DIAGNOSIS — Z113 Encounter for screening for infections with a predominantly sexual mode of transmission: Secondary | ICD-10-CM

## 2021-12-18 NOTE — Progress Notes (Signed)
SUBJECTIVE:  31 y.o. female who desires a STI screen. Reports abnormal vaginal discharge, bleeding or significant pelvic pain. No UTI symptoms. Denies history of known exposure to STD.  11/27/21 aprx.   OBJECTIVE:  She appears well.   ASSESSMENT:  STI Screen   PLAN:  Pt offered STI blood screening-ordered GC, chlamydia, and trichomonas probe sent to lab.  Treatment: To be determined once lab results are received.  Keep upcoming annual visit 02/10/22

## 2021-12-19 LAB — HIV ANTIBODY (ROUTINE TESTING W REFLEX): HIV Screen 4th Generation wRfx: NONREACTIVE

## 2021-12-19 LAB — RPR: RPR Ser Ql: NONREACTIVE

## 2021-12-19 LAB — HEPATITIS C ANTIBODY: Hep C Virus Ab: NONREACTIVE

## 2021-12-19 LAB — HEPATITIS B SURFACE ANTIGEN: Hepatitis B Surface Ag: NEGATIVE

## 2021-12-21 LAB — CERVICOVAGINAL ANCILLARY ONLY
Bacterial Vaginitis (gardnerella): POSITIVE — AB
Candida Glabrata: NEGATIVE
Candida Vaginitis: NEGATIVE
Chlamydia: NEGATIVE
Comment: NEGATIVE
Comment: NEGATIVE
Comment: NEGATIVE
Comment: NEGATIVE
Comment: NEGATIVE
Comment: NORMAL
Neisseria Gonorrhea: NEGATIVE
Trichomonas: NEGATIVE

## 2021-12-22 ENCOUNTER — Other Ambulatory Visit: Payer: Self-pay

## 2021-12-22 DIAGNOSIS — B9689 Other specified bacterial agents as the cause of diseases classified elsewhere: Secondary | ICD-10-CM

## 2021-12-22 MED ORDER — METRONIDAZOLE 500 MG PO TABS: 500.0000 mg | ORAL_TABLET | Freq: Two times a day (BID) | ORAL | 0 refills | Status: DC

## 2022-01-18 ENCOUNTER — Other Ambulatory Visit: Payer: Self-pay

## 2022-01-26 ENCOUNTER — Ambulatory Visit (INDEPENDENT_AMBULATORY_CARE_PROVIDER_SITE_OTHER): Payer: Medicaid Other | Admitting: Obstetrics

## 2022-01-26 ENCOUNTER — Encounter: Payer: Self-pay | Admitting: Obstetrics

## 2022-01-26 ENCOUNTER — Other Ambulatory Visit (HOSPITAL_COMMUNITY)
Admission: RE | Admit: 2022-01-26 | Discharge: 2022-01-26 | Disposition: A | Discharge status: Home / Self Care | Payer: Medicaid Other | Source: Ambulatory Visit | Attending: Obstetrics and Gynecology | Admitting: Obstetrics and Gynecology

## 2022-01-26 DIAGNOSIS — Z01419 Encounter for gynecological examination (general) (routine) without abnormal findings: Secondary | ICD-10-CM | POA: Insufficient documentation

## 2022-01-26 DIAGNOSIS — N760 Acute vaginitis: Secondary | ICD-10-CM | POA: Diagnosis not present

## 2022-01-26 DIAGNOSIS — N898 Other specified noninflammatory disorders of vagina: Secondary | ICD-10-CM

## 2022-01-26 DIAGNOSIS — B9689 Other specified bacterial agents as the cause of diseases classified elsewhere: Secondary | ICD-10-CM

## 2022-01-26 MED ORDER — METRONIDAZOLE 500 MG PO TABS: 500.0000 mg | ORAL_TABLET | Freq: Two times a day (BID) | ORAL | 2 refills | Status: DC

## 2022-01-26 NOTE — Progress Notes (Signed)
Pt presents for annual and pap.  ASCUS HR HPV pap 12/04/20 [facility] colpo 01/21/21

## 2022-01-26 NOTE — Progress Notes (Addendum)
Subjective:        Tammy Gardner is a 31 y.o. female here for a routine exam.  Current complaints: Vaginal discharge.    Personal health questionnaire:  Is patient Tammy Gardner, have a family history of breast and/or ovarian cancer: no Is there a family history of uterine cancer diagnosed at age < 75, gastrointestinal cancer, urinary tract cancer, family member who is a Field seismologist syndrome-associated carrier: no Is the patient overweight and hypertensive, family history of diabetes, personal history of gestational diabetes, preeclampsia or PCOS: no Is patient over 29, have PCOS,  family history of premature CHD under age 39, diabetes, smoke, have hypertension or peripheral artery disease:  no At any time, has a partner hit, kicked or otherwise hurt or frightened you?: no Over the past 2 weeks, have you felt down, depressed or hopeless?: no Over the past 2 weeks, have you felt little interest or pleasure in doing things?:no   Gynecologic History No LMP recorded. Contraception: condoms Last Pap: 2022 Results were: ASCUS with positive High Risk HPV Last mammogram: n/a. Results were: n/a  Obstetric History OB History  Gravida Para Term Preterm AB Living  2 1 1  0 1 1  SAB IAB Ectopic Multiple Live Births  0 1 0 0 1    # Outcome Date GA Lbr Len/2nd Weight Sex Delivery Anes PTL Lv  2 Term 11/29/15 [redacted]w[redacted]d 10:36 / 00:26 5 lb 7.5 oz (2.48 kg) F Vag-Spont EPI  LIV     Birth Comments: none  1 IAB 2014            Past Medical History:  Diagnosis Date   Bacterial vaginosis    Chlamydia    Trichomonas     Past Surgical History:  Procedure Laterality Date   INDUCED ABORTION       Current Outpatient Medications:    amphetamine-dextroamphetamine (ADDERALL XR) 10 MG 24 hr capsule, Take by mouth., Disp: , Rfl:    metroNIDAZOLE (FLAGYL) 500 MG tablet, Take 1 tablet (500 mg total) by mouth 2 (two) times daily., Disp: 14 tablet, Rfl: 2   cephALEXin (KEFLEX) 500 MG capsule, Take 1  capsule (500 mg total) by mouth 2 (two) times daily. (Patient not taking: Reported on 12/18/2021), Disp: 10 capsule, Rfl: 0   clotrimazole-betamethasone (LOTRISONE) cream, Apply 1 application topically 2 (two) times daily. (Patient not taking: Reported on 12/18/2021), Disp: 30 g, Rfl: 0   fluconazole (DIFLUCAN) 200 MG tablet, Take 1 tablet (200 mg total) by mouth every 3 (three) days. (Patient not taking: Reported on 12/18/2021), Disp: 3 tablet, Rfl: 2   metroNIDAZOLE (FLAGYL) 500 MG tablet, Take 1 tablet (500 mg total) by mouth 2 (two) times daily. (Patient not taking: Reported on 01/26/2022), Disp: 14 tablet, Rfl: 0 No Known Allergies  Social History   Tobacco Use   Smoking status: Every Day    Packs/day: 0.25    Years: 1.00    Total pack years: 0.25    Types: Cigarettes    Passive exposure: Current   Smokeless tobacco: Never   Tobacco comments:    Black & Milds  Substance Use Topics   Alcohol use: Yes    Comment: occasionally    Family History  Problem Relation Age of Onset   Hypertension Mother       Review of Systems  Constitutional: negative for fatigue and weight loss Respiratory: negative for cough and wheezing Cardiovascular: negative for chest pain, fatigue and palpitations Gastrointestinal: negative for abdominal pain and  change in bowel habits Musculoskeletal:negative for myalgias Neurological: negative for gait problems and tremors Behavioral/Psych: negative for abusive relationship, depression Endocrine: negative for temperature intolerance    Genitourinary:positive for vaginal discharge.  negative for abnormal menstrual periods, genital lesions, hot flashes, sexual problems  Integument/breast: negative for breast lump, breast tenderness, nipple discharge and skin lesion(s)    Objective:       There were no vitals taken for this visit. General:   Alert and no distress  Skin:   no rash or abnormalities  Lungs:   clear to auscultation bilaterally  Heart:    regular rate and rhythm, S1, S2 normal, no murmur, click, rub or gallop  Breasts:   normal without suspicious masses, skin or nipple changes or axillary nodes  Abdomen:  normal findings: no organomegaly, soft, non-tender and no hernia  Pelvis:  External genitalia: normal general appearance Urinary system: urethral meatus normal and bladder without fullness, nontender Vaginal: normal without tenderness, induration or masses Cervix: normal appearance Adnexa: normal bimanual exam Uterus: anteverted and non-tender, normal size   Lab Review Urine pregnancy test Labs reviewed yes Radiologic studies reviewed yes  I have spent a total of 20 minutes of face-to-face and non-face-to-face time, excluding clinical staff time, reviewing notes and preparing to see patient, ordering tests and/or medications, and counseling the patient.   Assessment:    1. Encounter for routine gynecological examination with Papanicolaou smear of cervix Rx: - Cytology - PAP( [place])  2. Vaginal discharge Rx: - Cervicovaginal ancillary only  3. BV (bacterial vaginosis) Rx: - metroNIDAZOLE (FLAGYL) 500 MG tablet; Take 1 tablet (500 mg total) by mouth 2 (two) times daily.  Dispense: 14 tablet; Refill: 2    Plan:    Education reviewed: calcium supplements, depression evaluation, low fat, low cholesterol diet, safe sex/STD prevention, self breast exams, smoking cessation, and weight bearing exercise. Contraception: condoms. Follow up in: 1 year.   Meds ordered this encounter  Medications   metroNIDAZOLE (FLAGYL) 500 MG tablet    Sig: Take 1 tablet (500 mg total) by mouth 2 (two) times daily.    Dispense:  14 tablet    Refill:  2     Brock Bad, MD 01/26/2022 9:07 AM

## 2022-01-27 ENCOUNTER — Other Ambulatory Visit: Payer: Self-pay | Admitting: Obstetrics

## 2022-01-27 LAB — CERVICOVAGINAL ANCILLARY ONLY
Bacterial Vaginitis (gardnerella): POSITIVE — AB
Candida Glabrata: NEGATIVE
Candida Vaginitis: NEGATIVE
Chlamydia: NEGATIVE
Comment: NEGATIVE
Comment: NEGATIVE
Comment: NEGATIVE
Comment: NEGATIVE
Comment: NEGATIVE
Comment: NORMAL
Neisseria Gonorrhea: NEGATIVE
Trichomonas: NEGATIVE

## 2022-01-28 ENCOUNTER — Other Ambulatory Visit: Payer: Self-pay | Admitting: *Deleted

## 2022-01-28 DIAGNOSIS — N898 Other specified noninflammatory disorders of vagina: Secondary | ICD-10-CM

## 2022-01-28 MED ORDER — FLUCONAZOLE 150 MG PO TABS: 150.0000 mg | ORAL_TABLET | Freq: Once | ORAL | 0 refills | Status: AC

## 2022-01-28 NOTE — Progress Notes (Signed)
RX diflucan per pt request. Reports taking Flagyl results in yeast infection.

## 2022-01-28 NOTE — Progress Notes (Signed)
Pt advised of results and RX sent. Requests RX diflucan for hx of yeast infection after taking Flagyl. RX sent.

## 2022-01-29 LAB — CYTOLOGY - PAP: Diagnosis: NEGATIVE

## 2022-02-10 ENCOUNTER — Ambulatory Visit: Payer: Medicaid Other | Admitting: Obstetrics and Gynecology

## 2022-03-10 ENCOUNTER — Other Ambulatory Visit: Payer: Self-pay | Admitting: Obstetrics

## 2022-03-10 ENCOUNTER — Other Ambulatory Visit: Payer: Self-pay | Admitting: Family Medicine

## 2022-03-10 DIAGNOSIS — B9689 Other specified bacterial agents as the cause of diseases classified elsewhere: Secondary | ICD-10-CM

## 2022-03-10 DIAGNOSIS — B369 Superficial mycosis, unspecified: Secondary | ICD-10-CM

## 2022-03-11 ENCOUNTER — Ambulatory Visit (INDEPENDENT_AMBULATORY_CARE_PROVIDER_SITE_OTHER): Payer: Medicaid Other | Admitting: General Practice

## 2022-03-11 ENCOUNTER — Other Ambulatory Visit (HOSPITAL_COMMUNITY)
Admission: RE | Admit: 2022-03-11 | Discharge: 2022-03-11 | Disposition: A | Discharge status: Home / Self Care | Payer: Medicaid Other | Source: Ambulatory Visit | Attending: Obstetrics and Gynecology | Admitting: Obstetrics and Gynecology

## 2022-03-11 VITALS — BP 120/78 | HR 87 | Ht 60.0 in | Wt 119.1 lb

## 2022-03-11 DIAGNOSIS — N898 Other specified noninflammatory disorders of vagina: Secondary | ICD-10-CM | POA: Diagnosis present

## 2022-03-11 NOTE — Progress Notes (Signed)
SUBJECTIVE:  31 y.o. female complains of white vaginal discharge for 4 day(s). Denies abnormal vaginal bleeding or significant pelvic pain or fever. No UTI symptoms. Denies history of known exposure to STD.  No LMP recorded.  OBJECTIVE:  She appears well, afebrile. Urine dipstick: not done.  ASSESSMENT:  Vaginal Discharge  Vaginal Odor   PLAN:  GC, chlamydia, trichomonas, BVAG, CVAG probe sent to lab. Treatment: To be determined once lab results are received ROV prn if symptoms persist or worsen.

## 2022-03-12 LAB — CERVICOVAGINAL ANCILLARY ONLY
Bacterial Vaginitis (gardnerella): POSITIVE — AB
Candida Glabrata: NEGATIVE
Candida Vaginitis: POSITIVE — AB
Chlamydia: NEGATIVE
Comment: NEGATIVE
Comment: NEGATIVE
Comment: NEGATIVE
Comment: NEGATIVE
Comment: NEGATIVE
Comment: NORMAL
Neisseria Gonorrhea: NEGATIVE
Trichomonas: NEGATIVE

## 2022-03-15 MED ORDER — METRONIDAZOLE 500 MG PO TABS: 500.0000 mg | ORAL_TABLET | Freq: Two times a day (BID) | ORAL | 0 refills | Status: DC

## 2022-03-15 MED ORDER — FLUCONAZOLE 150 MG PO TABS: 150.0000 mg | ORAL_TABLET | Freq: Once | ORAL | 0 refills | Status: AC

## 2022-03-15 NOTE — Addendum Note (Signed)
Addended by: Catalina Antigua on: 03/15/2022 08:34 AM   Modules accepted: Orders

## 2022-05-31 ENCOUNTER — Ambulatory Visit: Payer: Medicaid Other

## 2023-05-21 ENCOUNTER — Other Ambulatory Visit: Payer: Self-pay

## 2023-05-21 ENCOUNTER — Emergency Department (HOSPITAL_BASED_OUTPATIENT_CLINIC_OR_DEPARTMENT_OTHER)
Admission: EM | Admit: 2023-05-21 | Discharge: 2023-05-21 | Disposition: A | Discharge status: Home / Self Care | Payer: Medicaid Other | Attending: Emergency Medicine | Admitting: Emergency Medicine

## 2023-05-21 ENCOUNTER — Encounter (HOSPITAL_BASED_OUTPATIENT_CLINIC_OR_DEPARTMENT_OTHER): Payer: Self-pay | Admitting: Emergency Medicine

## 2023-05-21 DIAGNOSIS — M25512 Pain in left shoulder: Secondary | ICD-10-CM | POA: Diagnosis not present

## 2023-05-21 DIAGNOSIS — M549 Dorsalgia, unspecified: Secondary | ICD-10-CM

## 2023-05-21 DIAGNOSIS — M546 Pain in thoracic spine: Secondary | ICD-10-CM | POA: Insufficient documentation

## 2023-05-21 DIAGNOSIS — M25511 Pain in right shoulder: Secondary | ICD-10-CM | POA: Diagnosis not present

## 2023-05-21 MED ORDER — MELOXICAM 7.5 MG PO TABS: 7.5000 mg | ORAL_TABLET | Freq: Every day | ORAL | 0 refills | Status: AC

## 2023-05-21 MED ORDER — METHOCARBAMOL 500 MG PO TABS: 1000.0000 mg | ORAL_TABLET | Freq: Four times a day (QID) | ORAL | 0 refills | Status: AC

## 2023-05-21 NOTE — ED Provider Notes (Signed)
[place] [facility] Provider Note   CSN: [id number] Arrival date & time: 05/21/23  1348     History  Chief Complaint  Patient presents with   Back Pain    Tammy Gardner is a 33 y.o. female.  Patient presents to the emergency department today for evaluation of upper neck, bilateral shoulder pain ongoing for about 3 months.  This has been intermittent.  She states it will go away and then come back.  She denies any acute falls or injuries.  She denies numbness, weakness, or tingling to the arms.  No lower extremity symptoms.  She has tried cyclobenzaprine and over-the-counter Tylenol and ibuprofen without improvement.  No fevers. Patient does not report warning symptoms of back pain including: fecal incontinence, urinary retention or overflow incontinence, night sweats, waking from sleep with back pain, unexplained fevers or weight loss, h/o cancer, IVDU, recent trauma.  She states he has a family history of cancer and was hopeful that she could none ordered get a CT today.        Home Medications Prior to Admission medications   Medication Sig Start Date End Date Taking? Authorizing Provider  meloxicam (MOBIC) 7.5 MG tablet Take 1 tablet (7.5 mg total) by mouth daily. 05/21/23  Yes Renne Crigler, PA-C  methocarbamol (ROBAXIN) 500 MG tablet Take 2 tablets (1,000 mg total) by mouth 4 (four) times daily. 05/21/23  Yes Renne Crigler, PA-C  amphetamine-dextroamphetamine (ADDERALL XR) 10 MG 24 hr capsule Take by mouth. 12/16/20   [provider]      Allergies    Patient has no known allergies.    Review of Systems   Review of Systems  Physical Exam Updated Vital Signs BP 126/89 (BP Location: Left Arm)   Pulse 87   Temp 98 F (36.7 C) (Oral)   Resp 18   Ht 5' (1.524 m)   Wt 54 kg   LMP 05/07/2023   SpO2 100%   BMI 23.25 kg/m  Physical Exam Vitals and nursing note reviewed.  Constitutional:      Appearance: She is  well-developed.  HENT:     Head: Normocephalic and atraumatic.  Eyes:     Conjunctiva/sclera: Conjunctivae normal.  Pulmonary:     Effort: Pulmonary effort is normal.  Abdominal:     Palpations: Abdomen is soft.     Tenderness: There is no abdominal tenderness.  Musculoskeletal:        General: Normal range of motion.     Right shoulder: Tenderness present. No bony tenderness. Normal range of motion.     Left shoulder: Tenderness present. No bony tenderness. Normal range of motion.     Cervical back: Normal range of motion and neck supple. Spasms and tenderness present. Normal range of motion.     Thoracic back: No spasms or tenderness.     Comments: No step-off noted with palpation of spine.   Skin:    General: Skin is warm and dry.     Findings: No rash.  Neurological:     Mental Status: She is alert.     Sensory: No sensory deficit.     Comments: 5/5 strength in upper extremities bilaterally. No sensation deficit.      ED Results / Procedures / Treatments   Labs (all labs ordered are listed, but only abnormal results are displayed) Labs Reviewed - No data to display  EKG None  Radiology No results found.  Procedures Procedures    Medications  Ordered in ED Medications - No data to display  ED Course/ Medical Decision Making/ A&P    Patient seen and examined. History obtained directly from patient.    Labs/EKG: None ordered.  Imaging: None ordered. Considering imaging of the cervical with x-ray or CT but no red flags today and feel this would be low yield.   Medications/Fluids: None ordered  Most recent vital signs reviewed and are as follows: BP 126/89 (BP Location: Left Arm)   Pulse 87   Temp 98 F (36.7 C) (Oral)   Resp 18   Ht 5' (1.524 m)   Wt 54 kg   LMP 05/07/2023   SpO2 100%   BMI 23.25 kg/m   Initial impression: Neck and upper back pain without red flags  Home treatment plan: Patient was counseled on back pain precautions and advised to  do activity as tolerated but avoid strenuous activity and do not lift, push, or pull heavy objects more than 10 pounds for the next week.  Patient counseled to use ice or heat on back as needed for pain and spasm.    Medications prescribed: Trial meloxicam and Robaxin for muscle pain and spasm. Patient counseled on proper use of muscle relaxant medication.  They were requested not to drink alcohol, drive any vehicle, or do any dangerous activities while taking this medication due to potential drowsiness and unintended.  Patient verbalized understanding.  Return instructions discussed with patient: Urged to return with worsening severe pain, loss of bowel or bladder control, trouble walking, development of weakness in the legs, or with any other concerns.  Follow-up instructions discussed with patient: Patient urged to follow-up with PCP if pain does not improve with treatment and rest or if pain becomes recurrent.                                 Medical Decision Making Risk Prescription drug management.   Patient with neck and upper back pain.  No neurological deficits.  This is chronic in nature, recurrent.  Patient is ambulatory. No warning symptoms of back pain including: fecal incontinence, urinary retention or overflow incontinence, night sweats, waking from sleep with back pain, unexplained fevers or weight loss, personal h/o cancer, IVDU, recent trauma. No concern for cauda equina, epidural abscess, or other serious cause of back pain at this time. Conservative measures such as rest, ice/heat and pain medicine indicated with PCP follow-up if no improvement with conservative management.  Sports medicine referral given.  She would likely benefit from outpatient follow-up, potential physical therapy, advanced imaging if indicated.         Final Clinical Impression(s) / ED Diagnoses Final diagnoses:  Upper back pain    Rx / DC Orders ED Discharge Orders          Ordered     meloxicam (MOBIC) 7.5 MG tablet  Daily        05/21/23 1525    methocarbamol (ROBAXIN) 500 MG tablet  4 times daily        05/21/23 1525              Renne Crigler, PA-C 05/21/23 1530    Glyn Ade, MD 05/21/23 2225

## 2023-05-21 NOTE — Discharge Instructions (Signed)
Please read and follow all provided instructions.  Your diagnoses today include:  1. Upper back pain    Tests performed today include: Vital signs - see below for your results today  Medications prescribed:  Meloxicam - anti-inflammatory pain medication  You have been prescribed an anti-inflammatory medication or NSAID. Take with food. Do not take aspirin, ibuprofen, or naproxen if taking this medication. Take smallest effective dose for the shortest duration needed for your pain. Stop taking if you experience stomach pain or vomiting.   Robaxin (methocarbamol) - muscle relaxer medication  DO NOT drive or perform any activities that require you to be awake and alert because this medicine can make you drowsy.   Take any prescribed medications only as directed.  Home care instructions:  Follow any educational materials contained in this packet Please rest, use ice or heat on your back for the next several days Do not lift, push, pull anything more than 10 pounds for the next week  Follow-up instructions: Please follow-up with your primary care provider in the next 1 week for further evaluation of your symptoms.   Return instructions:  SEEK IMMEDIATE MEDICAL ATTENTION IF YOU HAVE: New numbness, tingling, weakness, or problem with the use of your arms or legs Severe back pain not relieved with medications Loss control of your bowels or bladder Increasing pain in any areas of the body (such as chest or abdominal pain) Shortness of breath, dizziness, or fainting.  Worsening nausea (feeling sick to your stomach), vomiting, fever, or sweats Any other emergent concerns regarding your health   Additional Information:  Your vital signs today were: BP 126/89 (BP Location: Left Arm)   Pulse 87   Temp 98 F (36.7 C) (Oral)   Resp 18   Ht 5' (1.524 m)   Wt 54 kg   LMP 05/07/2023   SpO2 100%   BMI 23.25 kg/m  If your blood pressure (BP) was elevated above 135/85 this visit, please  have this repeated by your doctor within one month. --------------

## 2023-05-21 NOTE — ED Triage Notes (Signed)
Pt c/o upper back and bil shoulder pain x months; no injury
